# Patient Record
Sex: Female | Born: 1985 | Race: Black or African American | Hispanic: No | Marital: Single | Smoking: Former smoker
Health system: Southern US, Community
[De-identification: ages and names within clinical notes are randomized; demographics above are authoritative.]

## PROBLEM LIST (undated history)

## (undated) DIAGNOSIS — F419 Anxiety disorder, unspecified: Secondary | ICD-10-CM

## (undated) DIAGNOSIS — F329 Major depressive disorder, single episode, unspecified: Secondary | ICD-10-CM

## (undated) DIAGNOSIS — G4700 Insomnia, unspecified: Secondary | ICD-10-CM

## (undated) DIAGNOSIS — I499 Cardiac arrhythmia, unspecified: Secondary | ICD-10-CM

## (undated) DIAGNOSIS — F4310 Post-traumatic stress disorder, unspecified: Secondary | ICD-10-CM

## (undated) DIAGNOSIS — F32A Depression, unspecified: Secondary | ICD-10-CM

## (undated) DIAGNOSIS — J45909 Unspecified asthma, uncomplicated: Secondary | ICD-10-CM

## (undated) HISTORY — PX: DILATION AND CURETTAGE OF UTERUS: SHX78

## (undated) HISTORY — PX: WRIST GANGLION EXCISION: SUR520

---
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
## 2010-02-05 ENCOUNTER — Emergency Department (HOSPITAL_COMMUNITY): Admission: EM | Admit: 2010-02-05 | Discharge: 2010-02-05 | Payer: Self-pay | Admitting: Emergency Medicine

## 2010-08-25 LAB — DIFFERENTIAL
Basophils Absolute: 0 10*3/uL (ref 0.0–0.1)
Lymphocytes Relative: 22 % (ref 12–46)
Lymphs Abs: 2.1 10*3/uL (ref 0.7–4.0)
Monocytes Absolute: 0.7 10*3/uL (ref 0.1–1.0)
Monocytes Relative: 7 % (ref 3–12)
Neutro Abs: 6.6 10*3/uL (ref 1.7–7.7)

## 2010-08-25 LAB — COMPREHENSIVE METABOLIC PANEL
ALT: 15 U/L (ref 0–35)
AST: 18 U/L (ref 0–37)
Albumin: 4.2 g/dL (ref 3.5–5.2)
BUN: 8 mg/dL (ref 6–23)
GFR calc non Af Amer: >60 mL/min (ref >60)
Total Bilirubin: 0.5 mg/dL (ref 0.3–1.2)

## 2010-08-25 LAB — URINALYSIS, ROUTINE W REFLEX MICROSCOPIC
Nitrite: NEGATIVE
Protein, ur: NEGATIVE mg/dL
pH: 6 (ref 5.0–8.0)

## 2010-08-25 LAB — CBC
Hemoglobin: 12.6 g/dL (ref 12.0–15.0)
MCHC: 31.9 g/dL (ref 30.0–36.0)
MCV: 78.2 fL (ref 78.0–100.0)
RBC: 5.05 MIL/uL (ref 3.87–5.11)
RDW: 13.2 % (ref 11.5–15.5)
WBC: 9.6 10*3/uL (ref 4.0–10.5)

## 2010-08-25 LAB — POCT PREGNANCY, URINE: Preg Test, Ur: NEGATIVE

## 2011-11-27 ENCOUNTER — Emergency Department (HOSPITAL_COMMUNITY): Payer: No Typology Code available for payment source

## 2011-11-27 ENCOUNTER — Encounter (HOSPITAL_COMMUNITY): Payer: Self-pay | Admitting: *Deleted

## 2011-11-27 ENCOUNTER — Emergency Department (HOSPITAL_COMMUNITY)
Admission: EM | Admit: 2011-11-27 | Discharge: 2011-11-27 | Disposition: A | Discharge status: Home / Self Care | Payer: No Typology Code available for payment source | Attending: Emergency Medicine | Admitting: Emergency Medicine

## 2011-11-27 DIAGNOSIS — M25529 Pain in unspecified elbow: Secondary | ICD-10-CM | POA: Insufficient documentation

## 2011-11-27 DIAGNOSIS — M79609 Pain in unspecified limb: Secondary | ICD-10-CM | POA: Insufficient documentation

## 2011-11-27 MED ORDER — CYCLOBENZAPRINE HCL 10 MG PO TABS: 10.0000 mg | ORAL_TABLET | Freq: Three times a day (TID) | ORAL | Status: AC | PRN

## 2011-11-27 MED ORDER — OXYCODONE-ACETAMINOPHEN 5-325 MG PO TABS
1.0000 | ORAL_TABLET | Freq: Once | ORAL | Status: AC
  Administered 2011-11-27: 1 via ORAL
  Filled 2011-11-27: qty 1

## 2011-11-27 MED ORDER — IBUPROFEN 800 MG PO TABS: 800.0000 mg | ORAL_TABLET | Freq: Three times a day (TID) | ORAL | Status: AC

## 2011-11-27 MED ORDER — IBUPROFEN 800 MG PO TABS: 800.0000 mg | ORAL_TABLET | Freq: Once | ORAL | Status: DC

## 2011-11-27 MED ORDER — HYDROXYZINE HCL 25 MG PO TABS
ORAL_TABLET | ORAL | Status: AC
  Filled 2011-11-27: qty 1

## 2011-11-27 MED ORDER — OXYCODONE-ACETAMINOPHEN 5-325 MG PO TABS: 2.0000 | ORAL_TABLET | ORAL | Status: AC | PRN
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
## 2011-11-29 ENCOUNTER — Encounter (HOSPITAL_COMMUNITY): Payer: Self-pay | Admitting: Emergency Medicine

## 2011-11-29 ENCOUNTER — Emergency Department (HOSPITAL_COMMUNITY): Payer: No Typology Code available for payment source

## 2011-11-29 ENCOUNTER — Emergency Department (HOSPITAL_COMMUNITY)
Admission: EM | Admit: 2011-11-29 | Discharge: 2011-11-29 | Disposition: A | Discharge status: Home / Self Care | Payer: No Typology Code available for payment source | Attending: Emergency Medicine | Admitting: Emergency Medicine

## 2011-11-29 DIAGNOSIS — S43402A Unspecified sprain of left shoulder joint, initial encounter: Secondary | ICD-10-CM

## 2011-11-29 DIAGNOSIS — J45909 Unspecified asthma, uncomplicated: Secondary | ICD-10-CM | POA: Insufficient documentation

## 2011-11-29 DIAGNOSIS — IMO0002 Reserved for concepts with insufficient information to code with codable children: Secondary | ICD-10-CM | POA: Insufficient documentation

## 2011-11-29 HISTORY — DX: Unspecified asthma, uncomplicated: J45.909

## 2011-11-29 MED ORDER — HYDROMORPHONE HCL PF 2 MG/ML IJ SOLN
2.0000 mg | Freq: Once | INTRAMUSCULAR | Status: AC
  Administered 2011-11-29: 2 mg via INTRAMUSCULAR
  Filled 2011-11-29: qty 1

## 2011-11-29 MED ORDER — HYDROCODONE-ACETAMINOPHEN 5-500 MG PO TABS: 1.0000 | ORAL_TABLET | Freq: Four times a day (QID) | ORAL | Status: AC | PRN

## 2011-11-29 MED ORDER — CYCLOBENZAPRINE HCL 10 MG PO TABS: 10.0000 mg | ORAL_TABLET | Freq: Two times a day (BID) | ORAL | Status: AC | PRN

## 2011-11-29 MED ORDER — IBUPROFEN 800 MG PO TABS: 800.0000 mg | ORAL_TABLET | Freq: Three times a day (TID) | ORAL | Status: AC

## 2011-11-29 NOTE — ED Provider Notes (Signed)
Medical screening examination/treatment/procedure(s) were performed by non-physician practitioner and as supervising physician I was immediately available for consultation/collaboration.   Lyanne Co, MD 11/29/11 1332
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
## 2011-11-29 NOTE — ED Notes (Signed)
Pt vomited x2

## 2011-11-29 NOTE — Discharge Instructions (Signed)
Your x-ray is negative. Keep sling on, continue to ice. Take medications for pain as prescribed. Follow up with orthopedics as referred.   Shoulder Sprain A shoulder sprain is the result of damage to the tough, fiber-like tissues (ligaments) that help hold your shoulder in place. The ligaments may be stretched or torn. Besides the main shoulder joint (the ball and socket), there are several smaller joints that connect the bones in this area. A sprain usually involves one of those joints. Most often it is the acromioclavicular (or AC) joint. That is the joint that connects the collarbone (clavicle) and the shoulder blade (scapula) at the top point of the shoulder blade (acromion). A shoulder sprain is a mild form of what is called a shoulder separation. Recovering from a shoulder sprain may take some time. For some, pain lingers for several months. Most people recover without long term problems. CAUSES   A shoulder sprain is usually caused by some kind of trauma. This might be:   Falling on an outstretched arm.   Being hit hard on the shoulder.   Twisting the arm.   Shoulder sprains are more likely to occur in people who:   Play sports.   Have balance or coordination problems.  SYMPTOMS   Pain when you move your shoulder.   Limited ability to move the shoulder.   Swelling and tenderness on top of the shoulder.   Redness or warmth in the shoulder.   Bruising.   A change in the shape of the shoulder.  DIAGNOSIS  Your healthcare provider may:  Ask about your symptoms.   Ask about recent activity that might have caused those symptoms.   Examine your shoulder. You may be asked to do simple exercises to test movement. The other shoulder will be examined for comparison.   Order some tests that provide a look inside the body. They can show the extent of the injury. The tests could include:   X-rays.   CT (computed tomography) scan.   MRI (magnetic resonance imaging) scan.    RISKS AND COMPLICATIONS  Loss of full shoulder motion.   Ongoing shoulder pain.  TREATMENT  How long it takes to recover from a shoulder sprain depends on how severe it was. Treatment options may include:  Rest. You should not use the arm or shoulder until it heals.   Ice. For 2 or 3 days after the injury, put an ice pack on the shoulder up to 4 times a day. It should stay on for 15 to 20 minutes each time. Wrap the ice in a towel so it does not touch your skin.   Over-the-counter medicine to relieve pain.   A sling or brace. This will keep the arm still while the shoulder is healing.   Physical therapy or rehabilitation exercises. These will help you regain strength and motion. Ask your healthcare provider when it is OK to begin these exercises.   Surgery. The need for surgery is rare with a sprained shoulder, but some people may need surgery to keep the joint in place and reduce pain.  HOME CARE INSTRUCTIONS   Ask your healthcare provider about what you should and should not do while your shoulder heals.   Make sure you know how to apply ice to the correct area of your shoulder.   Talk with your healthcare provider about which medications should be used for pain and swelling.   If rehabilitation therapy will be needed, ask your healthcare provider to refer you  to a therapist. If it is not recommended, then ask about at-home exercises. Find out when exercise should begin.  SEEK MEDICAL CARE IF:  Your pain, swelling, or redness at the joint increases. SEEK IMMEDIATE MEDICAL CARE IF:   You have a fever.   You cannot move your arm or shoulder.  Document Released: 10/14/2008 Document Revised: 05/17/2011 Document Reviewed: 10/14/2008 [facility] Patient Information 2012 [place], [place].

## 2011-11-29 NOTE — ED Provider Notes (Signed)
Medical screening examination/treatment/procedure(s) were performed by non-physician practitioner and as supervising physician I was immediately available for consultation/collaboration.   Dontez Hauss A Zayvon Alicea, MD 11/29/11 1618 

## 2011-11-29 NOTE — ED Notes (Signed)
Pt reports feeling lightheaded. HR 57 and regular.Denies nausea
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
## 2011-11-29 NOTE — ED Notes (Signed)
Pt was given 300 cc clear fluids. PA advised of low heart rate. PA stated that HR same as 2 days ago. Pt advised of low heart rate

## 2012-03-04 ENCOUNTER — Encounter (HOSPITAL_COMMUNITY): Payer: Self-pay | Admitting: Emergency Medicine

## 2012-03-04 ENCOUNTER — Emergency Department (HOSPITAL_COMMUNITY): Payer: Self-pay

## 2012-03-04 ENCOUNTER — Emergency Department (HOSPITAL_COMMUNITY)
Admission: EM | Admit: 2012-03-04 | Discharge: 2012-03-04 | Disposition: A | Discharge status: Home / Self Care | Payer: Self-pay | Attending: Emergency Medicine | Admitting: Emergency Medicine

## 2012-03-04 DIAGNOSIS — R51 Headache: Secondary | ICD-10-CM | POA: Insufficient documentation

## 2012-03-04 DIAGNOSIS — R079 Chest pain, unspecified: Secondary | ICD-10-CM | POA: Insufficient documentation

## 2012-03-04 DIAGNOSIS — R0789 Other chest pain: Secondary | ICD-10-CM

## 2012-03-04 DIAGNOSIS — J45909 Unspecified asthma, uncomplicated: Secondary | ICD-10-CM | POA: Insufficient documentation

## 2012-03-04 DIAGNOSIS — R55 Syncope and collapse: Secondary | ICD-10-CM | POA: Insufficient documentation

## 2012-03-04 DIAGNOSIS — R071 Chest pain on breathing: Secondary | ICD-10-CM | POA: Insufficient documentation

## 2012-03-04 DIAGNOSIS — F4310 Post-traumatic stress disorder, unspecified: Secondary | ICD-10-CM | POA: Insufficient documentation

## 2012-03-04 HISTORY — DX: Anxiety disorder, unspecified: F41.9

## 2012-03-04 HISTORY — DX: Cardiac arrhythmia, unspecified: I49.9

## 2012-03-04 LAB — CBC WITH DIFFERENTIAL/PLATELET
Basophils Relative: 0 % (ref 0–1)
Eosinophils Absolute: 0.2 10*3/uL (ref 0.0–0.7)
HCT: 33.6 % — ABNORMAL LOW (ref 36.0–46.0)
Hemoglobin: 10.7 g/dL — ABNORMAL LOW (ref 12.0–15.0)
MCHC: 31.8 g/dL (ref 30.0–36.0)
Monocytes Absolute: 0.5 10*3/uL (ref 0.1–1.0)
Monocytes Relative: 5 % (ref 3–12)
RBC: 4.47 MIL/uL (ref 3.87–5.11)

## 2012-03-04 LAB — COMPREHENSIVE METABOLIC PANEL
ALT: 12 U/L (ref 0–35)
Albumin: 3.2 g/dL — ABNORMAL LOW (ref 3.5–5.2)
BUN: 7 mg/dL (ref 6–23)
Calcium: 8.7 mg/dL (ref 8.4–10.5)
Chloride: 107 mEq/L (ref 96–112)
Creatinine, Ser: 0.61 mg/dL (ref 0.50–1.10)
Sodium: 138 mEq/L (ref 135–145)
Total Bilirubin: 0.2 mg/dL — ABNORMAL LOW (ref 0.3–1.2)

## 2012-03-04 LAB — URINALYSIS, ROUTINE W REFLEX MICROSCOPIC
Bilirubin Urine: NEGATIVE
Nitrite: NEGATIVE
Protein, ur: NEGATIVE mg/dL
Specific Gravity, Urine: 1.009 (ref 1.005–1.030)
pH: 7.5 (ref 5.0–8.0)

## 2012-03-04 LAB — POCT PREGNANCY, URINE: Preg Test, Ur: NEGATIVE

## 2012-03-04 MED ORDER — ALBUTEROL SULFATE HFA 108 (90 BASE) MCG/ACT IN AERS: 2.0000 | INHALATION_SPRAY | RESPIRATORY_TRACT | Status: DC | PRN

## 2012-03-04 MED ORDER — DIPHENHYDRAMINE HCL 50 MG/ML IJ SOLN
25.0000 mg | Freq: Once | INTRAMUSCULAR | Status: AC
  Administered 2012-03-04: 25 mg via INTRAVENOUS
  Filled 2012-03-04: qty 1

## 2012-03-04 MED ORDER — METOCLOPRAMIDE HCL 5 MG/ML IJ SOLN
10.0000 mg | Freq: Once | INTRAMUSCULAR | Status: AC
  Administered 2012-03-04: 10 mg via INTRAVENOUS
  Filled 2012-03-04: qty 2

## 2012-03-04 MED ORDER — DEXAMETHASONE SODIUM PHOSPHATE 10 MG/ML IJ SOLN
10.0000 mg | Freq: Once | INTRAMUSCULAR | Status: AC
  Administered 2012-03-04: 10 mg via INTRAVENOUS
  Filled 2012-03-04: qty 1

## 2012-03-04 MED ORDER — SODIUM CHLORIDE 0.9 % IV BOLUS (SEPSIS)
1000.0000 mL | Freq: Once | INTRAVENOUS | Status: AC
  Administered 2012-03-04: 1000 mL via INTRAVENOUS

## 2012-03-04 MED ORDER — ALBUTEROL SULFATE HFA 108 (90 BASE) MCG/ACT IN AERS
2.0000 | INHALATION_SPRAY | RESPIRATORY_TRACT | Status: DC | PRN
  Administered 2012-03-04: 2 via RESPIRATORY_TRACT
  Filled 2012-03-04: qty 6.7

## 2012-03-04 MED ORDER — ONDANSETRON HCL 4 MG/2ML IJ SOLN
4.0000 mg | Freq: Once | INTRAMUSCULAR | Status: AC
  Administered 2012-03-04: 4 mg via INTRAVENOUS
  Filled 2012-03-04: qty 2

## 2012-03-04 MED ORDER — MORPHINE SULFATE 4 MG/ML IJ SOLN
4.0000 mg | Freq: Once | INTRAMUSCULAR | Status: AC
  Administered 2012-03-04: 4 mg via INTRAVENOUS
  Filled 2012-03-04: qty 1
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
## 2012-03-04 NOTE — ED Notes (Signed)
Per EMS: Pt was found down at [facility].  Unknown LOC.  Yesterday and this morning, pt has been vomiting.  Initially, pt was breathing 120/min for at least 5 mins.  Pt unwilling to respond to most questions.  When she does answer, pt speaks very softly.  Answers to all questions that she does answer are appropriate.  Pt now breathing about 80 times a min.  Pt c/o chest wall pain (worse with breathing and palpation), abdominal pain.

## 2012-03-04 NOTE — ED Notes (Signed)
ZOX:[id number]<VW> Expected date:<BR> Expected time:<BR> Means of arrival:<BR> Comments:<BR> [id number] hyperventilation; Hx anxiety; irreg HR

## 2012-04-06 ENCOUNTER — Encounter (HOSPITAL_COMMUNITY): Payer: Self-pay | Admitting: *Deleted

## 2012-04-06 ENCOUNTER — Emergency Department (HOSPITAL_COMMUNITY)
Admission: EM | Admit: 2012-04-06 | Discharge: 2012-04-06 | Disposition: A | Discharge status: Home / Self Care | Payer: Self-pay | Attending: Emergency Medicine | Admitting: Emergency Medicine

## 2012-04-06 DIAGNOSIS — F17200 Nicotine dependence, unspecified, uncomplicated: Secondary | ICD-10-CM | POA: Insufficient documentation

## 2012-04-06 DIAGNOSIS — Z8679 Personal history of other diseases of the circulatory system: Secondary | ICD-10-CM | POA: Insufficient documentation

## 2012-04-06 DIAGNOSIS — F411 Generalized anxiety disorder: Secondary | ICD-10-CM | POA: Insufficient documentation

## 2012-04-06 DIAGNOSIS — Z79899 Other long term (current) drug therapy: Secondary | ICD-10-CM | POA: Insufficient documentation

## 2012-04-06 DIAGNOSIS — J45909 Unspecified asthma, uncomplicated: Secondary | ICD-10-CM | POA: Insufficient documentation

## 2012-04-06 DIAGNOSIS — K047 Periapical abscess without sinus: Secondary | ICD-10-CM | POA: Insufficient documentation

## 2012-04-06 MED ORDER — HYDROCODONE-ACETAMINOPHEN 5-325 MG PO TABS
2.0000 | ORAL_TABLET | Freq: Once | ORAL | Status: AC
  Administered 2012-04-06: 2 via ORAL
  Filled 2012-04-06: qty 2

## 2012-04-06 MED ORDER — HYDROCODONE-ACETAMINOPHEN 5-500 MG PO TABS: 1.0000 | ORAL_TABLET | Freq: Four times a day (QID) | ORAL | Status: DC | PRN

## 2012-04-06 MED ORDER — PENICILLIN V POTASSIUM 500 MG PO TABS
500.0000 mg | ORAL_TABLET | Freq: Once | ORAL | Status: AC
  Administered 2012-04-06: 500 mg via ORAL
  Filled 2012-04-06: qty 1

## 2012-04-06 MED ORDER — PENICILLIN V POTASSIUM 500 MG PO TABS: 500.0000 mg | ORAL_TABLET | Freq: Four times a day (QID) | ORAL | Status: DC

## 2012-04-06 NOTE — ED Provider Notes (Signed)
History     CSN: [id number]  Arrival date & time 04/06/12  2002   First MD Initiated Contact with Patient 04/06/12 2031      Chief Complaint  Patient presents with  . Dental Pain    (Consider location/radiation/quality/duration/timing/severity/associated sxs/prior treatment) HPI Comments: This is a 26 year old female, who presents to the emergency department with chief complaint of dental pain. Patient states that the pain began several days ago, and is now radiating to her throat and jaw. Patient has not tried anything for pain. She is in moderate to severe discomfort.  The history is provided by the patient. No language interpreter was used.    Past Medical History  Diagnosis Date  . Asthma   . Anxiety   . Arrhythmia     Past Surgical History  Procedure Date  . Wrist ganglion excision     Family History  Problem Relation Age of Onset  . Hypertension Mother     History  Substance Use Topics  . Smoking status: Current Some Day Smoker    Types: Cigars  . Smokeless tobacco: Not on file  . Alcohol Use: Yes     socia;    OB History    Grav Para Term Preterm Abortions TAB SAB Ect Mult Living                  Review of Systems  HENT: Positive for dental problem.        Left lower tooth/dental abscess, surrounding oral mucosa erythematous and swollen.  All other systems reviewed and are negative.    Allergies  Calamine-diphenhydramine-lanolin oil  Home Medications   Current Outpatient Rx  Name Route Sig Dispense Refill  . ACETAMINOPHEN 500 MG PO TABS Oral Take 500 mg by mouth every 6 (six) hours as needed. pain    . ALBUTEROL SULFATE HFA 108 (90 BASE) MCG/ACT IN AERS Inhalation Inhale 2 puffs into the lungs every 6 (six) hours as needed. wheezing    . HYDROCODONE-ACETAMINOPHEN 5-500 MG PO TABS Oral Take 1-2 tablets by mouth every 6 (six) hours as needed for pain. 15 tablet 0  . PENICILLIN V POTASSIUM 500 MG PO TABS Oral Take 1 tablet (500 mg total) by  mouth 4 (four) times daily. 40 tablet 0    BP 123/94  Pulse 93  Temp 99.2 F (37.3 C) (Oral)  Resp 16  Ht 5\' 2"  (1.575 m)  Wt 175 lb (79.379 kg)  BMI 32.01 kg/m2  SpO2 100%  LMP 03/10/2012  Physical Exam  Nursing note and vitals reviewed. Constitutional: She is oriented to person, place, and time. She appears well-developed and well-nourished.  HENT:  Head: Normocephalic and atraumatic.       Left lower tooth abscess, surrounding oral mucosa erythematous and swollen. Airway intact.  Eyes: Conjunctivae normal and EOM are normal. Pupils are equal, round, and reactive to light.  Neck: Normal range of motion. Neck supple.  Cardiovascular: Normal rate, regular rhythm and normal heart sounds.   Pulmonary/Chest: Effort normal and breath sounds normal.  Abdominal: Soft. Bowel sounds are normal.  Musculoskeletal: Normal range of motion.  Neurological: She is alert and oriented to person, place, and time.  Skin: Skin is warm and dry.  Psychiatric: She has a normal mood and affect. Her behavior is normal. Judgment and thought content normal.    ED Course  Procedures (including critical care time)  Labs Reviewed - No data to display No results found.   1. Dental abscess  MDM  This is a 26 year old female with tooth abscess. This patient has been seen by and discussed with Dr. Bruce Donath. I am going to discharge the patient with penicillin and hydrocodone for her infection and pain management. Will give her one dose of penicillin tonight. Will instruct the patient to see a dentist as soon as possible. I have given her a list of resources. Patient is stable and ready for discharge.        Roxy Horseman, PA-C 04/06/12 2136

## 2012-04-06 NOTE — ED Notes (Signed)
Pt reports left side mouth pain x several days; swelling to left back of gums and cheek; pain radiating to throat and causing headache; denies difficulty breathing; able to swallow but is painful to swallow.

## 2012-04-06 NOTE — ED Provider Notes (Signed)
Medical screening examination/treatment/procedure(s) were performed by non-physician practitioner and as supervising physician I was immediately available for consultation/collaboration.  Toy Baker, MD 04/06/12 2239

## 2012-11-17 ENCOUNTER — Emergency Department (HOSPITAL_COMMUNITY)
Admission: EM | Admit: 2012-11-17 | Discharge: 2012-11-17 | Disposition: A | Discharge status: Home / Self Care | Payer: Self-pay | Attending: Emergency Medicine | Admitting: Emergency Medicine

## 2012-11-17 DIAGNOSIS — Z3202 Encounter for pregnancy test, result negative: Secondary | ICD-10-CM | POA: Insufficient documentation

## 2012-11-17 DIAGNOSIS — F411 Generalized anxiety disorder: Secondary | ICD-10-CM | POA: Insufficient documentation

## 2012-11-17 DIAGNOSIS — F419 Anxiety disorder, unspecified: Secondary | ICD-10-CM

## 2012-11-17 DIAGNOSIS — J45901 Unspecified asthma with (acute) exacerbation: Secondary | ICD-10-CM | POA: Insufficient documentation

## 2012-11-17 DIAGNOSIS — Z8679 Personal history of other diseases of the circulatory system: Secondary | ICD-10-CM | POA: Insufficient documentation

## 2012-11-17 DIAGNOSIS — R109 Unspecified abdominal pain: Secondary | ICD-10-CM | POA: Insufficient documentation

## 2012-11-17 DIAGNOSIS — Z79899 Other long term (current) drug therapy: Secondary | ICD-10-CM | POA: Insufficient documentation

## 2012-11-17 DIAGNOSIS — F17200 Nicotine dependence, unspecified, uncomplicated: Secondary | ICD-10-CM | POA: Insufficient documentation

## 2012-11-17 LAB — URINALYSIS, ROUTINE W REFLEX MICROSCOPIC
Bilirubin Urine: NEGATIVE
Glucose, UA: NEGATIVE mg/dL
Ketones, ur: NEGATIVE mg/dL
Nitrite: NEGATIVE
pH: 7.5 (ref 5.0–8.0)

## 2012-11-17 LAB — POCT I-STAT, CHEM 8
BUN: 9 mg/dL (ref 6–23)
Chloride: 109 mEq/L (ref 96–112)
Creatinine, Ser: 0.7 mg/dL (ref 0.50–1.10)
Glucose, Bld: 87 mg/dL (ref 70–99)
Potassium: 4 mEq/L (ref 3.5–5.1)

## 2012-11-17 LAB — URINE MICROSCOPIC-ADD ON

## 2012-11-17 MED ORDER — NITROFURANTOIN MONOHYD MACRO 100 MG PO CAPS: 100.0000 mg | ORAL_CAPSULE | Freq: Two times a day (BID) | ORAL | Status: DC

## 2012-11-17 MED ORDER — ALBUTEROL SULFATE HFA 108 (90 BASE) MCG/ACT IN AERS: 2.0000 | INHALATION_SPRAY | RESPIRATORY_TRACT | Status: DC | PRN

## 2012-11-17 MED ORDER — LORAZEPAM 1 MG PO TABS
1.0000 mg | ORAL_TABLET | Freq: Once | ORAL | Status: AC
  Administered 2012-11-17: 1 mg via ORAL
  Filled 2012-11-17: qty 1

## 2012-11-17 NOTE — ED Notes (Signed)
Pt states mom passed recently with a stroke.  Pt states that chest tightness started x 1 week ago.  Shortness of breath with any activity.  Pt also states has hx of arrythmia.

## 2012-11-17 NOTE — ED Provider Notes (Signed)
History     CSN: [id number]  Arrival date & time 11/17/12  3664   First MD Initiated Contact with Patient 11/17/12 [phone]      Chief Complaint  Patient presents with  . Chest Pain  . Shortness of Breath    (Consider location/radiation/quality/duration/timing/severity/associated sxs/prior treatment) HPI Complaint of chest pain intermittent since her mother died 2012-09-22. Pain is described as tightness accompanied by shortness of breath not made worse by exertion. It is made worse when she develops anxiety. She also complains of abdominal pain, hypogastric and location onset approximately 3 weeks ago . Abdominal pain is also intermittent. Last bowel movement yesterday normal. No vaginal discharge. She does report a foul smell to her urine. She grits and eggs yesterday. Last normal menstrual period 11/11/2012 no treatment prior to coming here nothing makes abdominal pain better or worse.  Past Medical History  Diagnosis Date  . Asthma   . Anxiety   . Arrhythmia    Arrhythmia for which she does not take medication Past Surgical History  Procedure Laterality Date  . Wrist ganglion excision      Family History  Problem Relation Age of Onset  . Hypertension Mother     History  Substance Use Topics  . Smoking status: Current Some Day Smoker    Types: Cigars  . Smokeless tobacco: Not on file  . Alcohol Use: Yes     Comment: socia;    OB History   Grav Para Term Preterm Abortions TAB SAB Ect Mult Living                  Review of Systems  Constitutional: Negative.   HENT: Negative.   Respiratory: Positive for shortness of breath.   Cardiovascular: Positive for chest pain.  Gastrointestinal: Positive for abdominal pain.  Musculoskeletal: Negative.   Skin: Negative.   Neurological: Negative.   Psychiatric/Behavioral: Negative.   All other systems reviewed and are negative.    Allergies  Calamine-diphenhydramine-lanolin oil  Home Medications   Current Outpatient  Rx  Name  Route  Sig  Dispense  Refill  . acetaminophen (TYLENOL) 500 MG tablet   Oral   Take 500 mg by mouth every 6 (six) hours as needed. pain         . albuterol (PROVENTIL HFA;VENTOLIN HFA) 108 (90 BASE) MCG/ACT inhaler   Inhalation   Inhale 2 puffs into the lungs every 6 (six) hours as needed. wheezing         . HYDROcodone-acetaminophen (VICODIN) 5-500 MG per tablet   Oral   Take 1-2 tablets by mouth every 6 (six) hours as needed for pain.   15 tablet   0   . penicillin v potassium (VEETID) 500 MG tablet   Oral   Take 1 tablet (500 mg total) by mouth 4 (four) times daily.   40 tablet   0     BP 115/75  Pulse 80  Resp 18  SpO2 100%  LMP 11/11/2012  Physical Exam  Nursing note and vitals reviewed. Constitutional: She is oriented to person, place, and time. She appears well-developed and well-nourished. She appears distressed.  Teary-eyed anxious appearing  HENT:  Head: Normocephalic and atraumatic.  Eyes: Conjunctivae are normal. Pupils are equal, round, and reactive to light.  Neck: Neck supple. No tracheal deviation present. No thyromegaly present.  Cardiovascular: Normal rate and regular rhythm.   No murmur heard. Pulmonary/Chest: Effort normal and breath sounds normal.  Abdominal: Soft. Bowel sounds are normal. She exhibits  no distension. There is no tenderness.  Obese  Musculoskeletal: Normal range of motion. She exhibits no edema and no tenderness.  Neurological: She is alert and oriented to person, place, and time. Coordination normal.  Skin: Skin is warm and dry. No rash noted.  Psychiatric: She has a normal mood and affect.    ED Course  Procedures (including critical care time)  Labs Reviewed - No data to display No results found.   No diagnosis found.   Date: 11/17/2012  Rate: 95  Rhythm: normal sinus rhythm  QRS Axis: normal  Intervals: QT prolonged  ST/T Wave abnormalities: nonspecific T wave changes  Conduction Disutrbances:none   Narrative Interpretation:   Old EKG Reviewed: Corrected QTC interval was 0.57 which is long compared to 03/04/2012 interpreted by me  Results for orders placed during the hospital encounter of 11/17/12  URINALYSIS, ROUTINE W REFLEX MICROSCOPIC      Result Value Range   Color, Urine YELLOW  YELLOW   APPearance CLOUDY (*) CLEAR   Specific Gravity, Urine 1.025  1.005 - 1.030   pH 7.5  5.0 - 8.0   Glucose, UA NEGATIVE  NEGATIVE mg/dL   Hgb urine dipstick SMALL (*) NEGATIVE   Bilirubin Urine NEGATIVE  NEGATIVE   Ketones, ur NEGATIVE  NEGATIVE mg/dL   Protein, ur 30 (*) NEGATIVE mg/dL   Urobilinogen, UA 1.0  0.0 - 1.0 mg/dL   Nitrite NEGATIVE  NEGATIVE   Leukocytes, UA LARGE (*) NEGATIVE  PREGNANCY, URINE      Result Value Range   Preg Test, Ur NEGATIVE  NEGATIVE  URINE MICROSCOPIC-ADD ON      Result Value Range   Squamous Epithelial / LPF FEW (*) RARE   WBC, UA TOO NUMEROUS TO COUNT  <3 WBC/hpf   Bacteria, UA MANY (*) RARE   Urine-Other MUCOUS PRESENT    POCT I-STAT, CHEM 8      Result Value Range   Sodium 143  135 - 145 mEq/L   Potassium 4.0  3.5 - 5.1 mEq/L   Chloride 109  96 - 112 mEq/L   BUN 9  6 - 23 mg/dL   Creatinine, Ser 1.61  0.50 - 1.10 mg/dL   Glucose, Bld 87  70 - 99 mg/dL   Calcium, Ion 0.96  0.45 - 1.23 mmol/L   TCO2 25  0 - 100 mmol/L   Hemoglobin 12.6  12.0 - 15.0 g/dL   HCT 40.9  81.1 - 91.4 %   No results found.   MDM  I feel the chest pain is not consistent with acute coronary syndrome rather related to anxiety. In light of her history of arrhythmia for which I can find no documentation, and long QT measured on today's EKG [facility] Abdominal pain explained by uti Plan rx macrobid, referral resource guide for grief councilling         Doug Sou, MD 11/17/12 1709

## 2012-11-17 NOTE — ED Provider Notes (Signed)
History     CSN: [id number]  Arrival date & time 11/17/12  7829   First MD Initiated Contact with Patient 11/17/12 [phone]      Chief Complaint  Patient presents with  . Chest Pain  . Shortness of Breath    (Consider location/radiation/quality/duration/timing/severity/associated sxs/prior treatment) HPI  Past Medical History  Diagnosis Date  . Asthma   . Anxiety   . Arrhythmia     Past Surgical History  Procedure Laterality Date  . Wrist ganglion excision      Family History  Problem Relation Age of Onset  . Hypertension Mother     History  Substance Use Topics  . Smoking status: Current Some Day Smoker    Types: Cigars  . Smokeless tobacco: Not on file  . Alcohol Use: Yes     Comment: socia;    OB History   Grav Para Term Preterm Abortions TAB SAB Ect Mult Living                  Review of Systems  Allergies  Calamine-diphenhydramine-lanolin oil  Home Medications   Current Outpatient Rx  Name  Route  Sig  Dispense  Refill  . naproxen sodium (ANAPROX) 220 MG tablet   Oral   Take 220 mg by mouth 3 (three) times daily as needed (pain).          Marland Kitchen albuterol (PROVENTIL HFA;VENTOLIN HFA) 108 (90 BASE) MCG/ACT inhaler   Inhalation   Inhale 2 puffs into the lungs every 4 (four) hours as needed for wheezing.   1 Inhaler   0   . nitrofurantoin, macrocrystal-monohydrate, (MACROBID) 100 MG capsule   Oral   Take 1 capsule (100 mg total) by mouth 2 (two) times daily.   10 capsule   0     BP 115/75  Pulse 80  Temp(Src) 98.5 F (36.9 C) (Oral)  Resp 18  SpO2 100%  LMP 11/11/2012  Physical Exam  ED Course  Procedures (including critical care time)  Labs Reviewed  URINALYSIS, ROUTINE W REFLEX MICROSCOPIC - Abnormal; Notable for the following:    APPearance CLOUDY (*)    Hgb urine dipstick SMALL (*)    Protein, ur 30 (*)    Leukocytes, UA LARGE (*)    All other components within normal limits  URINE MICROSCOPIC-ADD ON - Abnormal; Notable  for the following:    Squamous Epithelial / LPF FEW (*)    Bacteria, UA MANY (*)    All other components within normal limits  URINE CULTURE  PREGNANCY, URINE  POCT I-STAT, CHEM 8   No results found.   1. Anxiety    10:20 AM patient feels improved after treatment with Ativan. Results for orders placed during the hospital encounter of 11/17/12  URINALYSIS, ROUTINE W REFLEX MICROSCOPIC      Result Value Range   Color, Urine YELLOW  YELLOW   APPearance CLOUDY (*) CLEAR   Specific Gravity, Urine 1.025  1.005 - 1.030   pH 7.5  5.0 - 8.0   Glucose, UA NEGATIVE  NEGATIVE mg/dL   Hgb urine dipstick SMALL (*) NEGATIVE   Bilirubin Urine NEGATIVE  NEGATIVE   Ketones, ur NEGATIVE  NEGATIVE mg/dL   Protein, ur 30 (*) NEGATIVE mg/dL   Urobilinogen, UA 1.0  0.0 - 1.0 mg/dL   Nitrite NEGATIVE  NEGATIVE   Leukocytes, UA LARGE (*) NEGATIVE  PREGNANCY, URINE      Result Value Range   Preg Test, Ur NEGATIVE  NEGATIVE  URINE MICROSCOPIC-ADD ON      Result Value Range   Squamous Epithelial / LPF FEW (*) RARE   WBC, UA TOO NUMEROUS TO COUNT  <3 WBC/hpf   Bacteria, UA MANY (*) RARE   Urine-Other MUCOUS PRESENT    POCT I-STAT, CHEM 8      Result Value Range   Sodium 143  135 - 145 mEq/L   Potassium 4.0  3.5 - 5.1 mEq/L   Chloride 109  96 - 112 mEq/L   BUN 9  6 - 23 mg/dL   Creatinine, Ser 4.54  0.50 - 1.10 mg/dL   Glucose, Bld 87  70 - 99 mg/dL   Calcium, Ion 0.98  1.19 - 1.23 mmol/L   TCO2 25  0 - 100 mmol/L   Hemoglobin 12.6  12.0 - 15.0 g/dL   HCT 14.7  82.9 - 56.2 %   No results found.   MDM  I feel that cause of pts chest pain  Is anxiety and she gwets tearful when speaking of death of her mother. Long QT is incidental finding on EKG.  Spoke with [facility] cardiology to arrange out pt offcie visit .  Plan rx albuterol (pt requests refill as has no pmd, ) macrobid'  Referral resource guide. Dx # 1 atypical chest pain #2 anxiety #3 uti        Doug Sou, MD 11/17/12  1032

## 2012-11-17 NOTE — Progress Notes (Signed)
[facility] has seen patient and provided her with a list of primary care resources. °

## 2012-11-19 LAB — URINE CULTURE

## 2012-11-21 NOTE — ED Notes (Signed)
Post ED Visit - Positive Culture Follow-up  Culture report reviewed by antimicrobial stewardship pharmacist: [x]  Wes Dulaney, Pharm.D., BCPS []  Celedonio Miyamoto, Pharm.D., BCPS []  Georgina Pillion, Pharm.D., BCPS []  [place], [place].D., BCPS, AAHIVP []  Estella Husk, Pharm.D., BCPS, AAHIVP  Positive urine culture Treated with Nitrofuratoin organism sensitive to the same and no further patient follow-up is required at this time.  Larena Sox 11/21/2012, 11:47 AM

## 2012-12-10 ENCOUNTER — Encounter: Payer: Self-pay | Admitting: Internal Medicine

## 2012-12-11 ENCOUNTER — Encounter: Payer: Self-pay | Admitting: Internal Medicine

## 2012-12-28 ENCOUNTER — Emergency Department (HOSPITAL_COMMUNITY)
Admission: EM | Admit: 2012-12-28 | Discharge: 2012-12-28 | Disposition: A | Discharge status: Home / Self Care | Payer: Self-pay | Attending: Emergency Medicine | Admitting: Emergency Medicine

## 2012-12-28 ENCOUNTER — Encounter (HOSPITAL_COMMUNITY): Payer: Self-pay

## 2012-12-28 DIAGNOSIS — M545 Low back pain, unspecified: Secondary | ICD-10-CM | POA: Insufficient documentation

## 2012-12-28 DIAGNOSIS — M549 Dorsalgia, unspecified: Secondary | ICD-10-CM

## 2012-12-28 DIAGNOSIS — F411 Generalized anxiety disorder: Secondary | ICD-10-CM | POA: Insufficient documentation

## 2012-12-28 DIAGNOSIS — R109 Unspecified abdominal pain: Secondary | ICD-10-CM | POA: Insufficient documentation

## 2012-12-28 DIAGNOSIS — R259 Unspecified abnormal involuntary movements: Secondary | ICD-10-CM | POA: Insufficient documentation

## 2012-12-28 DIAGNOSIS — Z8679 Personal history of other diseases of the circulatory system: Secondary | ICD-10-CM | POA: Insufficient documentation

## 2012-12-28 DIAGNOSIS — Z87828 Personal history of other (healed) physical injury and trauma: Secondary | ICD-10-CM | POA: Insufficient documentation

## 2012-12-28 DIAGNOSIS — J45909 Unspecified asthma, uncomplicated: Secondary | ICD-10-CM | POA: Insufficient documentation

## 2012-12-28 DIAGNOSIS — F17200 Nicotine dependence, unspecified, uncomplicated: Secondary | ICD-10-CM | POA: Insufficient documentation

## 2012-12-28 DIAGNOSIS — R112 Nausea with vomiting, unspecified: Secondary | ICD-10-CM | POA: Insufficient documentation

## 2012-12-28 DIAGNOSIS — Z3202 Encounter for pregnancy test, result negative: Secondary | ICD-10-CM | POA: Insufficient documentation

## 2012-12-28 LAB — COMPREHENSIVE METABOLIC PANEL
ALT: 12 U/L (ref 0–35)
AST: 14 U/L (ref 0–37)
Albumin: 3.9 g/dL (ref 3.5–5.2)
Alkaline Phosphatase: 80 U/L (ref 39–117)
Chloride: 106 mEq/L (ref 96–112)
Potassium: 4 mEq/L (ref 3.5–5.1)
Sodium: 139 mEq/L (ref 135–145)
Total Bilirubin: 0.2 mg/dL — ABNORMAL LOW (ref 0.3–1.2)
Total Protein: 7.5 g/dL (ref 6.0–8.3)

## 2012-12-28 LAB — CBC WITH DIFFERENTIAL/PLATELET
Basophils Absolute: 0 10*3/uL (ref 0.0–0.1)
Basophils Relative: 0 % (ref 0–1)
Eosinophils Absolute: 0.2 10*3/uL (ref 0.0–0.7)
Hemoglobin: 11.5 g/dL — ABNORMAL LOW (ref 12.0–15.0)
MCH: 23.3 pg — ABNORMAL LOW (ref 26.0–34.0)
MCHC: 30.8 g/dL (ref 30.0–36.0)
Neutro Abs: 6.1 10*3/uL (ref 1.7–7.7)
Neutrophils Relative %: 63 % (ref 43–77)
Platelets: 194 10*3/uL (ref 150–400)
RBC: 4.93 MIL/uL (ref 3.87–5.11)

## 2012-12-28 LAB — URINALYSIS, ROUTINE W REFLEX MICROSCOPIC
Bilirubin Urine: NEGATIVE
Glucose, UA: NEGATIVE mg/dL
Hgb urine dipstick: NEGATIVE
Ketones, ur: NEGATIVE mg/dL
Nitrite: NEGATIVE
Specific Gravity, Urine: 1.019 (ref 1.005–1.030)
pH: 7 (ref 5.0–8.0)

## 2012-12-28 LAB — URINE MICROSCOPIC-ADD ON

## 2012-12-28 LAB — LIPASE, BLOOD: Lipase: 14 U/L (ref 11–59)

## 2012-12-28 MED ORDER — SODIUM CHLORIDE 0.9 % IV BOLUS (SEPSIS): 1000.0000 mL | Freq: Once | INTRAVENOUS | Status: DC

## 2012-12-28 MED ORDER — HYDROMORPHONE HCL PF 1 MG/ML IJ SOLN: 0.5000 mg | Freq: Once | INTRAMUSCULAR | Status: DC

## 2012-12-28 MED ORDER — CYCLOBENZAPRINE HCL 10 MG PO TABS: 10.0000 mg | ORAL_TABLET | Freq: Two times a day (BID) | ORAL | Status: DC | PRN

## 2012-12-28 MED ORDER — ONDANSETRON HCL 4 MG PO TABS
4.0000 mg | ORAL_TABLET | Freq: Once | ORAL | Status: AC
  Administered 2012-12-28: 4 mg via ORAL
  Filled 2012-12-28: qty 1

## 2012-12-28 MED ORDER — ONDANSETRON HCL 4 MG/2ML IJ SOLN: 4.0000 mg | Freq: Once | INTRAMUSCULAR | Status: DC

## 2012-12-28 MED ORDER — OXYCODONE-ACETAMINOPHEN 5-325 MG PO TABS
2.0000 | ORAL_TABLET | Freq: Once | ORAL | Status: AC
  Administered 2012-12-28: 2 via ORAL
  Filled 2012-12-28: qty 2

## 2012-12-28 MED ORDER — NAPROXEN 500 MG PO TABS: 500.0000 mg | ORAL_TABLET | Freq: Two times a day (BID) | ORAL | Status: DC

## 2012-12-28 MED ORDER — OXYCODONE-ACETAMINOPHEN 5-325 MG PO TABS: 2.0000 | ORAL_TABLET | ORAL | Status: DC | PRN

## 2012-12-28 NOTE — ED Notes (Signed)
Pt reports onset lower back pain last night, then started to have n/v/d. Tried to drink ginger ale but could not tolerate. Was unable to sleep last night because of the back pain
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
## 2012-12-28 NOTE — ED Notes (Signed)
EDP Hyacinth Meeker advised not to start IV at this time.  Will hold IV meds and wait for lab results.

## 2012-12-31 LAB — URINE CULTURE: Colony Count: 75000

## 2013-01-01 NOTE — ED Notes (Signed)
+   urine No treatment needed per Sharilyn Sites

## 2015-03-10 ENCOUNTER — Emergency Department (HOSPITAL_COMMUNITY): Payer: No Typology Code available for payment source

## 2015-03-10 ENCOUNTER — Encounter (HOSPITAL_COMMUNITY): Payer: Self-pay

## 2015-03-10 ENCOUNTER — Emergency Department (HOSPITAL_COMMUNITY)
Admission: EM | Admit: 2015-03-10 | Discharge: 2015-03-10 | Disposition: A | Discharge status: Home / Self Care | Payer: No Typology Code available for payment source | Attending: Emergency Medicine | Admitting: Emergency Medicine

## 2015-03-10 DIAGNOSIS — S199XXA Unspecified injury of neck, initial encounter: Secondary | ICD-10-CM | POA: Diagnosis present

## 2015-03-10 DIAGNOSIS — F410 Panic disorder [episodic paroxysmal anxiety] without agoraphobia: Secondary | ICD-10-CM | POA: Insufficient documentation

## 2015-03-10 DIAGNOSIS — Z720 Tobacco use: Secondary | ICD-10-CM | POA: Diagnosis not present

## 2015-03-10 DIAGNOSIS — S29012A Strain of muscle and tendon of back wall of thorax, initial encounter: Secondary | ICD-10-CM | POA: Insufficient documentation

## 2015-03-10 DIAGNOSIS — S2002XA Contusion of left breast, initial encounter: Secondary | ICD-10-CM | POA: Insufficient documentation

## 2015-03-10 DIAGNOSIS — Y999 Unspecified external cause status: Secondary | ICD-10-CM | POA: Diagnosis not present

## 2015-03-10 DIAGNOSIS — S161XXA Strain of muscle, fascia and tendon at neck level, initial encounter: Secondary | ICD-10-CM | POA: Diagnosis not present

## 2015-03-10 DIAGNOSIS — R1030 Lower abdominal pain, unspecified: Secondary | ICD-10-CM

## 2015-03-10 DIAGNOSIS — S4992XA Unspecified injury of left shoulder and upper arm, initial encounter: Secondary | ICD-10-CM | POA: Diagnosis not present

## 2015-03-10 DIAGNOSIS — J45909 Unspecified asthma, uncomplicated: Secondary | ICD-10-CM | POA: Insufficient documentation

## 2015-03-10 DIAGNOSIS — S3991XA Unspecified injury of abdomen, initial encounter: Secondary | ICD-10-CM | POA: Insufficient documentation

## 2015-03-10 DIAGNOSIS — R1110 Vomiting, unspecified: Secondary | ICD-10-CM | POA: Diagnosis not present

## 2015-03-10 DIAGNOSIS — Y9389 Activity, other specified: Secondary | ICD-10-CM | POA: Insufficient documentation

## 2015-03-10 DIAGNOSIS — Y92410 Unspecified street and highway as the place of occurrence of the external cause: Secondary | ICD-10-CM | POA: Diagnosis not present

## 2015-03-10 LAB — URINALYSIS, ROUTINE W REFLEX MICROSCOPIC
BILIRUBIN URINE: NEGATIVE
GLUCOSE, UA: NEGATIVE mg/dL
HGB URINE DIPSTICK: NEGATIVE
Ketones, ur: NEGATIVE mg/dL
Leukocytes, UA: NEGATIVE
Nitrite: NEGATIVE
PROTEIN: NEGATIVE mg/dL
SPECIFIC GRAVITY, URINE: 1.015 (ref 1.005–1.030)
Urobilinogen, UA: 1 mg/dL (ref 0.0–1.0)
pH: 7.5 (ref 5.0–8.0)

## 2015-03-10 LAB — I-STAT BETA HCG BLOOD, ED (MC, WL, AP ONLY): I-stat hCG, quantitative: <5 m[IU]/mL (ref <5)

## 2015-03-10 LAB — I-STAT CHEM 8, ED
BUN: 6 mg/dL (ref 6–20)
CALCIUM ION: 1.21 mmol/L (ref 1.12–1.23)
CHLORIDE: 106 mmol/L (ref 101–111)
Creatinine, Ser: 0.7 mg/dL (ref 0.44–1.00)
GLUCOSE: 84 mg/dL (ref 65–99)
HCT: 39 % (ref 36.0–46.0)
Hemoglobin: 13.3 g/dL (ref 12.0–15.0)
Potassium: 4.4 mmol/L (ref 3.5–5.1)
Sodium: 139 mmol/L (ref 135–145)
TCO2: 24 mmol/L (ref 0–100)

## 2015-03-10 MED ORDER — DIAZEPAM 5 MG PO TABS: 5.0000 mg | ORAL_TABLET | Freq: Two times a day (BID) | ORAL | Status: DC | PRN

## 2015-03-10 MED ORDER — DIAZEPAM 5 MG PO TABS
5.0000 mg | ORAL_TABLET | Freq: Once | ORAL | Status: AC
  Administered 2015-03-10: 5 mg via ORAL
  Filled 2015-03-10: qty 1

## 2015-03-10 MED ORDER — NAPROXEN 500 MG PO TABS: 500.0000 mg | ORAL_TABLET | Freq: Two times a day (BID) | ORAL | Status: DC

## 2015-03-10 MED ORDER — HYDROCODONE-ACETAMINOPHEN 5-325 MG PO TABS
2.0000 | ORAL_TABLET | Freq: Once | ORAL | Status: AC
  Administered 2015-03-10: 2 via ORAL
  Filled 2015-03-10: qty 2

## 2015-03-10 MED ORDER — IOHEXOL 300 MG/ML  SOLN
100.0000 mL | Freq: Once | INTRAMUSCULAR | Status: AC | PRN
  Administered 2015-03-10: 100 mL via INTRAVENOUS
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
## 2015-03-10 NOTE — ED Notes (Signed)
Pt is in stable condition upon d/c and is escorted from ED via wheelchair. 

## 2015-05-13 ENCOUNTER — Emergency Department (HOSPITAL_COMMUNITY): Payer: Self-pay

## 2015-05-13 ENCOUNTER — Encounter (HOSPITAL_COMMUNITY): Payer: Self-pay | Admitting: Emergency Medicine

## 2015-05-13 ENCOUNTER — Emergency Department (HOSPITAL_COMMUNITY)
Admission: EM | Admit: 2015-05-13 | Discharge: 2015-05-13 | Disposition: A | Discharge status: Home / Self Care | Payer: Self-pay | Attending: Emergency Medicine | Admitting: Emergency Medicine

## 2015-05-13 DIAGNOSIS — W19XXXA Unspecified fall, initial encounter: Secondary | ICD-10-CM

## 2015-05-13 DIAGNOSIS — Z791 Long term (current) use of non-steroidal anti-inflammatories (NSAID): Secondary | ICD-10-CM | POA: Insufficient documentation

## 2015-05-13 DIAGNOSIS — S8992XA Unspecified injury of left lower leg, initial encounter: Secondary | ICD-10-CM | POA: Insufficient documentation

## 2015-05-13 DIAGNOSIS — Z8679 Personal history of other diseases of the circulatory system: Secondary | ICD-10-CM | POA: Insufficient documentation

## 2015-05-13 DIAGNOSIS — S99912A Unspecified injury of left ankle, initial encounter: Secondary | ICD-10-CM | POA: Insufficient documentation

## 2015-05-13 DIAGNOSIS — Y9389 Activity, other specified: Secondary | ICD-10-CM | POA: Insufficient documentation

## 2015-05-13 DIAGNOSIS — Y9289 Other specified places as the place of occurrence of the external cause: Secondary | ICD-10-CM | POA: Insufficient documentation

## 2015-05-13 DIAGNOSIS — J45909 Unspecified asthma, uncomplicated: Secondary | ICD-10-CM | POA: Insufficient documentation

## 2015-05-13 DIAGNOSIS — W1789XA Other fall from one level to another, initial encounter: Secondary | ICD-10-CM | POA: Insufficient documentation

## 2015-05-13 DIAGNOSIS — F419 Anxiety disorder, unspecified: Secondary | ICD-10-CM | POA: Insufficient documentation

## 2015-05-13 DIAGNOSIS — F17210 Nicotine dependence, cigarettes, uncomplicated: Secondary | ICD-10-CM | POA: Insufficient documentation

## 2015-05-13 DIAGNOSIS — Z79899 Other long term (current) drug therapy: Secondary | ICD-10-CM | POA: Insufficient documentation

## 2015-05-13 DIAGNOSIS — Y998 Other external cause status: Secondary | ICD-10-CM | POA: Insufficient documentation

## 2015-05-13 MED ORDER — NAPROXEN 250 MG PO TABS
500.0000 mg | ORAL_TABLET | Freq: Once | ORAL | Status: AC
  Administered 2015-05-13: 500 mg via ORAL
  Filled 2015-05-13: qty 2

## 2015-05-13 MED ORDER — NAPROXEN 500 MG PO TABS: 500.0000 mg | ORAL_TABLET | Freq: Two times a day (BID) | ORAL | Status: DC

## 2015-05-13 NOTE — ED Provider Notes (Signed)
CSN: [id number]     Arrival date & time 05/13/15  1244 History  By signing my name below, I, Randa Evens, attest that this documentation has been prepared under the direction and in the presence of Quincy Carnes, PA-C. Electronically Signed: Randa Evens, ED Scribe. 05/13/2015. 1:53 PM.    Chief Complaint  Patient presents with  . Leg Pain   The history is provided by the patient. No language interpreter was used.   HPI Comments: Traci Rogers is a 29 y.o. female who presents to the Emergency Department complaining of left leg pain onset 1 day prior. Pt is also complaining of pain in her left foot and left ankle. Pt states that she stepped off the porch twisting her ankle due to an opossum startling her. She states that she stepped backwards off the porch and lost her footing. She states that the pain is worse with movement and bearing weight. Pt has been applying ice since being in the ED. Pt denies numbness or tingling. No head injury or LOC.  No prior ankle injuries/surgeries.  VSS.  Past Medical History  Diagnosis Date  . Asthma   . Anxiety   . Arrhythmia    Past Surgical History  Procedure Laterality Date  . Wrist ganglion excision     Family History  Problem Relation Age of Onset  . Hypertension Mother    Social History  Substance Use Topics  . Smoking status: Current Some Day Smoker    Types: Cigars  . Smokeless tobacco: None  . Alcohol Use: Yes     Comment: socia;   OB History    No data available     Review of Systems  Musculoskeletal: Positive for arthralgias.  Neurological: Negative for numbness.      Allergies  Calamine  Home Medications   Prior to Admission medications   Medication Sig Start Date End Date Taking? Authorizing Provider  diazepam (VALIUM) 5 MG tablet Take 1 tablet (5 mg total) by mouth every 12 (twelve) hours as needed for muscle spasms. 03/10/15   Carman Ching, PA-C  Multiple Vitamins-Minerals (MULTIVITAMIN WITH MINERALS)  tablet Take 1 tablet by mouth daily.    Historical Provider, MD  naproxen (NAPROSYN) 500 MG tablet Take 1 tablet (500 mg total) by mouth 2 (two) times daily. 03/10/15   Robyn M Hess, PA-C  naproxen sodium (ANAPROX) 220 MG tablet Take 220 mg by mouth 3 (three) times daily as needed (pain).     Historical Provider, MD  oxyCODONE-acetaminophen (PERCOCET/ROXICET) 5-325 MG per tablet Take 2 tablets by mouth every 4 (four) hours as needed for pain. 12/28/12   Noemi Chapel, MD   BP 107/74 mmHg  Pulse 79  Temp(Src) 98.6 F (37 C) (Oral)  Resp 18  SpO2 99%  LMP 04/22/2015   Physical Exam  Constitutional: She is oriented to person, place, and time. She appears well-developed and well-nourished.  HENT:  Head: Normocephalic and atraumatic.  Mouth/Throat: Oropharynx is clear and moist.  Eyes: Conjunctivae and EOM are normal. Pupils are equal, round, and reactive to light.  Neck: Normal range of motion.  Cardiovascular: Normal rate, regular rhythm and normal heart sounds.   Pulmonary/Chest: Effort normal and breath sounds normal.  Abdominal: Soft. Bowel sounds are normal.  Musculoskeletal:       Left ankle: She exhibits decreased range of motion. She exhibits no swelling, no ecchymosis, no deformity, no laceration and normal pulse. Tenderness. Lateral malleolus and medial malleolus tenderness found. Achilles tendon normal.  Left  ankle diffusely tender without significant swelling, bruising, abrasions, or bony deformity; limited ROM due to pain/poor patient effort; DP pulse intact; normal sensation throughout foot; moving all toes appropriately  Neurological: She is alert and oriented to person, place, and time.  Skin: Skin is warm and dry.  Psychiatric: She has a normal mood and affect.  Nursing note and vitals reviewed.   ED Course  ORTHOPEDIC INJURY TREATMENT Date/Time: 05/13/2015 2:13 PM Performed by: Larene Pickett Authorized by: Larene Pickett Consent: Verbal consent obtained. Risks and  benefits: risks, benefits and alternatives were discussed Consent given by: patient Patient understanding: patient states understanding of the procedure being performed Patient consent: the patient's understanding of the procedure matches consent given Required items: required blood products, implants, devices, and special equipment available Patient identity confirmed: verbally with patient Injury location: ankle Location details: left ankle Injury type: soft tissue Pre-procedure neurovascular assessment: neurovascularly intact Immobilization: brace Supplies used: aluminum splint Post-procedure neurovascular assessment: post-procedure neurovascularly intact Patient tolerance: Patient tolerated the procedure well with no immediate complications   (including critical care time) DIAGNOSTIC STUDIES: Oxygen Saturation is 99% on RA, normal by my interpretation.    COORDINATION OF CARE: 2:02 PM-Discussed treatment plan with pt at bedside and pt agreed to plan.     Labs Review Labs Reviewed - No data to display  Imaging Review Dg Tibia/fibula Left  05/13/2015  CLINICAL DATA:  Twisted LEFT ankle while jumping off porch and running from an opossum 1 day ago, medial anterior LEFT ankle/ foot pain, initial encounter EXAM: LEFT TIBIA AND FIBULA - 2 VIEW COMPARISON:  None FINDINGS: Osseous mineralization normal. Joint spaces preserved. No fracture, dislocation, or bone destruction. IMPRESSION: Normal exam. Electronically Signed   By: Lavonia Dana M.D.   On: 05/13/2015 13:43   Dg Ankle Complete Left  05/13/2015  CLINICAL DATA:  Twisted left ankle wall jumping. EXAM: LEFT FOOT - COMPLETE 3+ VIEW; LEFT ANKLE COMPLETE - 3+ VIEW COMPARISON:  None. FINDINGS: There is no evidence of fracture or dislocation. The ankle mortise is intact. There is no evidence of arthropathy or other focal bone abnormality. Soft tissues are unremarkable. IMPRESSION: No acute osseous injury of the left foot or ankle.  Electronically Signed   By: Kathreen Devoid   On: 05/13/2015 13:46   Dg Foot Complete Left  05/13/2015  CLINICAL DATA:  Twisted left ankle wall jumping. EXAM: LEFT FOOT - COMPLETE 3+ VIEW; LEFT ANKLE COMPLETE - 3+ VIEW COMPARISON:  None. FINDINGS: There is no evidence of fracture or dislocation. The ankle mortise is intact. There is no evidence of arthropathy or other focal bone abnormality. Soft tissues are unremarkable. IMPRESSION: No acute osseous injury of the left foot or ankle. Electronically Signed   By: Kathreen Devoid   On: 05/13/2015 13:46   I have personally reviewed and evaluated these images results as part of my medical decision-making.   EKG Interpretation None      MDM   Final diagnoses:  Fall, initial encounter  Left ankle injury, initial encounter   29 year old female here with left ankle pain after falling off porch last night. On exam she has diffuse left ankle pain without significant swelling, bony deformity, bruising or laceration. Her foot is neurovascularly intact. X-rays were obtained which are negative for acute findings. Suspect sprain type injury. Patient placed in ASO ankle brace and given crutches. She will progress back to full weightbearing as tolerated. Follow-up given if no improvement in the next week. Rx Naprosyn.  Discussed plan with patient, he/she acknowledged understanding and agreed with plan of care.  Return precautions given for new or worsening symptoms.  I personally performed the services described in this documentation, which was scribed in my presence. The recorded information has been reviewed and is accurate.  Larene Pickett, PA-C 05/13/15 1414  Veryl Speak, MD 05/13/15 [phone]

## 2015-05-13 NOTE — Discharge Instructions (Signed)
Take the prescribed medication as directed.  Ice and elevate ankle at home to help with pain/swelling. Follow-up with orthopedics if continue having issues after a week or so. Return to the ED for new or worsening symptoms.  RICE for Routine Care of Injuries Theroutine careofmanyinjuriesincludes rest, ice, compression, and elevation (RICE therapy). RICE therapy is often recommended for injuries to soft tissues, such as a muscle strain, ligament injuries, bruises, and overuse injuries. It can also be used for some bony injuries. Using RICE therapy can help to relieve pain, lessen swelling, and enable your body to heal. Rest Rest is required to allow your body to heal. This usually involves reducing your normal activities and avoiding use of the injured part of your body. Generally, you can return to your normal activities when you are comfortable and have been given permission by your health care provider. Ice Icing your injury helps to keep the swelling down, and it lessens pain. Do not apply ice directly to your skin.  Put ice in a plastic bag.  Place a towel between your skin and the bag.  Leave the ice on for 20 minutes, 2-3 times a day. Do this for as long as you are directed by your health care provider. Compression Compression means putting pressure on the injured area. Compression helps to keep swelling down, gives support, and helps with discomfort. Compression may be done with an elastic bandage. If an elastic bandage has been applied, follow these general tips:  Remove and reapply the bandage every 3-4 hours or as directed by your health care provider.  Make sure the bandage is not wrapped too tightly, because this can cut off circulation. If part of your body beyond the bandage becomes blue, numb, cold, swollen, or more painful, your bandage is most likely too tight. If this occurs, remove your bandage and reapply it more loosely.  See your health care provider if the bandage  seems to be making your problems worse rather than better. Elevation Elevation means keeping the injured area raised. This helps to lessen swelling and decrease pain. If possible, your injured area should be elevated at or above the level of your heart or the center of your chest. [place]? You should seek medical care if:  Your pain and swelling continue.  Your symptoms are getting worse rather than improving. These symptoms may indicate that further evaluation or further X-rays are needed. Sometimes, X-rays may not show a small broken bone (fracture) until a number of days later. Make a follow-up appointment with your health care provider. WHEN SHOULD I SEEK IMMEDIATE MEDICAL CARE? You should seek immediate medical care if:  You have sudden severe pain at or below the area of your injury.  You have redness or increased swelling around your injury.  You have tingling or numbness at or below the area of your injury that does not improve after you remove the elastic bandage.   This information is not intended to replace advice given to you by your health care provider. Make sure you discuss any questions you have with your health care provider.   Document Released: 09/09/2000 Document Revised: 02/16/2015 Document Reviewed: 05/05/2014 Elsevier Interactive Patient Education [employer].

## 2015-05-13 NOTE — ED Notes (Signed)
Pt given ice pack for left ankle

## 2015-05-13 NOTE — ED Notes (Signed)
Pt twisted left ankle last pm. C/o left foot,ankle and lower leg pain.

## 2015-07-17 ENCOUNTER — Encounter (HOSPITAL_COMMUNITY): Payer: Self-pay

## 2015-07-17 ENCOUNTER — Emergency Department (HOSPITAL_COMMUNITY)
Admission: EM | Admit: 2015-07-17 | Discharge: 2015-07-17 | Disposition: A | Discharge status: Home / Self Care | Payer: Self-pay | Attending: Emergency Medicine | Admitting: Emergency Medicine

## 2015-07-17 ENCOUNTER — Emergency Department (HOSPITAL_COMMUNITY): Payer: Self-pay

## 2015-07-17 DIAGNOSIS — Y999 Unspecified external cause status: Secondary | ICD-10-CM | POA: Insufficient documentation

## 2015-07-17 DIAGNOSIS — S8391XA Sprain of unspecified site of right knee, initial encounter: Secondary | ICD-10-CM | POA: Insufficient documentation

## 2015-07-17 DIAGNOSIS — Y9289 Other specified places as the place of occurrence of the external cause: Secondary | ICD-10-CM | POA: Insufficient documentation

## 2015-07-17 DIAGNOSIS — Y9389 Activity, other specified: Secondary | ICD-10-CM | POA: Insufficient documentation

## 2015-07-17 DIAGNOSIS — F17210 Nicotine dependence, cigarettes, uncomplicated: Secondary | ICD-10-CM | POA: Insufficient documentation

## 2015-07-17 DIAGNOSIS — Z791 Long term (current) use of non-steroidal anti-inflammatories (NSAID): Secondary | ICD-10-CM | POA: Insufficient documentation

## 2015-07-17 DIAGNOSIS — J45909 Unspecified asthma, uncomplicated: Secondary | ICD-10-CM | POA: Insufficient documentation

## 2015-07-17 DIAGNOSIS — F419 Anxiety disorder, unspecified: Secondary | ICD-10-CM | POA: Insufficient documentation

## 2015-07-17 MED ORDER — LORAZEPAM 2 MG/ML IJ SOLN
2.0000 mg | Freq: Once | INTRAMUSCULAR | Status: AC
  Administered 2015-07-17: 2 mg via INTRAMUSCULAR
  Filled 2015-07-17: qty 1

## 2015-07-17 MED ORDER — OXYCODONE-ACETAMINOPHEN 5-325 MG PO TABS
1.0000 | ORAL_TABLET | Freq: Once | ORAL | Status: AC
  Administered 2015-07-17: 1 via ORAL
  Filled 2015-07-17: qty 1

## 2015-07-17 MED ORDER — IBUPROFEN 600 MG PO TABS: 600.0000 mg | ORAL_TABLET | Freq: Three times a day (TID) | ORAL | Status: DC | PRN

## 2015-07-17 MED ORDER — LORAZEPAM 2 MG/ML IJ SOLN
2.0000 mg | Freq: Once | INTRAMUSCULAR | Status: DC
Start: 2015-07-17 — End: 2015-07-17

## 2015-07-17 NOTE — ED Notes (Signed)
Pt fell earlier by slipping and landed on her right knee. Hurts to bear weight.

## 2015-07-17 NOTE — ED Notes (Signed)
Ortho returned call. On way to ED to see patient.

## 2015-07-17 NOTE — ED Notes (Signed)
Ortho paged. Patient will need crutches prior to discharge.

## 2015-07-17 NOTE — ED Notes (Signed)
Pt given a ice pack to placed on her rt knee.

## 2015-07-17 NOTE — ED Notes (Signed)
Patient transported to X-ray 
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
## 2015-08-30 ENCOUNTER — Encounter (HOSPITAL_COMMUNITY): Payer: Self-pay | Admitting: Emergency Medicine

## 2015-08-30 ENCOUNTER — Emergency Department (HOSPITAL_COMMUNITY): Payer: Self-pay

## 2015-08-30 DIAGNOSIS — F17210 Nicotine dependence, cigarettes, uncomplicated: Secondary | ICD-10-CM | POA: Insufficient documentation

## 2015-08-30 DIAGNOSIS — R61 Generalized hyperhidrosis: Secondary | ICD-10-CM | POA: Insufficient documentation

## 2015-08-30 DIAGNOSIS — J45901 Unspecified asthma with (acute) exacerbation: Secondary | ICD-10-CM | POA: Insufficient documentation

## 2015-08-30 DIAGNOSIS — R079 Chest pain, unspecified: Secondary | ICD-10-CM | POA: Insufficient documentation

## 2015-08-30 LAB — BASIC METABOLIC PANEL
ANION GAP: 9 (ref 5–15)
BUN: 8 mg/dL (ref 6–20)
CHLORIDE: 107 mmol/L (ref 101–111)
CO2: 23 mmol/L (ref 22–32)
Calcium: 9.9 mg/dL (ref 8.9–10.3)
Creatinine, Ser: 0.73 mg/dL (ref 0.44–1.00)
Glucose, Bld: 89 mg/dL (ref 65–99)
POTASSIUM: 4.6 mmol/L (ref 3.5–5.1)
SODIUM: 139 mmol/L (ref 135–145)

## 2015-08-30 LAB — I-STAT TROPONIN, ED: Troponin i, poc: 0 ng/mL (ref 0.00–0.08)

## 2015-08-30 LAB — CBC
HEMATOCRIT: 37.5 % (ref 36.0–46.0)
HEMOGLOBIN: 11.8 g/dL — AB (ref 12.0–15.0)
MCH: 24 pg — ABNORMAL LOW (ref 26.0–34.0)
MCHC: 31.5 g/dL (ref 30.0–36.0)
MCV: 76.2 fL — AB (ref 78.0–100.0)
Platelets: 235 10*3/uL (ref 150–400)
RBC: 4.92 MIL/uL (ref 3.87–5.11)
RDW: 13 % (ref 11.5–15.5)
WBC: 14.1 10*3/uL — AB (ref 4.0–10.5)

## 2015-08-30 NOTE — ED Notes (Signed)
Pt. reports central/left chest pain with SOB , dry cough and diaphoresis onset this evening , denies nausea or fever .

## 2015-08-31 ENCOUNTER — Emergency Department (HOSPITAL_COMMUNITY)
Admission: EM | Admit: 2015-08-31 | Discharge: 2015-08-31 | Disposition: A | Discharge status: Home / Self Care | Payer: Self-pay | Attending: Emergency Medicine | Admitting: Emergency Medicine

## 2015-08-31 NOTE — ED Notes (Signed)
Pts name called for a room no answer 

## 2015-10-07 ENCOUNTER — Emergency Department (HOSPITAL_COMMUNITY)
Admission: EM | Admit: 2015-10-07 | Discharge: 2015-10-07 | Disposition: A | Discharge status: Home / Self Care | Payer: No Typology Code available for payment source | Attending: Emergency Medicine | Admitting: Emergency Medicine

## 2015-10-07 ENCOUNTER — Encounter (HOSPITAL_COMMUNITY): Payer: Self-pay | Admitting: *Deleted

## 2015-10-07 DIAGNOSIS — R51 Headache: Secondary | ICD-10-CM | POA: Insufficient documentation

## 2015-10-07 DIAGNOSIS — Z79891 Long term (current) use of opiate analgesic: Secondary | ICD-10-CM | POA: Insufficient documentation

## 2015-10-07 DIAGNOSIS — R519 Headache, unspecified: Secondary | ICD-10-CM

## 2015-10-07 DIAGNOSIS — Z7982 Long term (current) use of aspirin: Secondary | ICD-10-CM | POA: Insufficient documentation

## 2015-10-07 DIAGNOSIS — J45909 Unspecified asthma, uncomplicated: Secondary | ICD-10-CM | POA: Insufficient documentation

## 2015-10-07 DIAGNOSIS — Z79899 Other long term (current) drug therapy: Secondary | ICD-10-CM | POA: Insufficient documentation

## 2015-10-07 DIAGNOSIS — Z87891 Personal history of nicotine dependence: Secondary | ICD-10-CM | POA: Insufficient documentation

## 2015-10-07 DIAGNOSIS — Z791 Long term (current) use of non-steroidal anti-inflammatories (NSAID): Secondary | ICD-10-CM | POA: Insufficient documentation

## 2015-10-07 MED ORDER — OXYCODONE-ACETAMINOPHEN 5-325 MG PO TABS: 1.0000 | ORAL_TABLET | ORAL | Status: DC | PRN

## 2015-10-07 MED ORDER — METOCLOPRAMIDE HCL 5 MG/ML IJ SOLN
10.0000 mg | Freq: Once | INTRAMUSCULAR | Status: AC
  Administered 2015-10-07: 10 mg via INTRAVENOUS
  Filled 2015-10-07: qty 2

## 2015-10-07 MED ORDER — DIPHENHYDRAMINE HCL 50 MG/ML IJ SOLN
25.0000 mg | Freq: Once | INTRAMUSCULAR | Status: AC
  Administered 2015-10-07: 25 mg via INTRAVENOUS
  Filled 2015-10-07: qty 1

## 2015-10-07 MED ORDER — SODIUM CHLORIDE 0.9 % IV BOLUS (SEPSIS)
1000.0000 mL | Freq: Once | INTRAVENOUS | Status: AC
  Administered 2015-10-07: 1000 mL via INTRAVENOUS

## 2015-10-07 MED ORDER — PROMETHAZINE HCL 25 MG PO TABS: 25.0000 mg | ORAL_TABLET | Freq: Four times a day (QID) | ORAL | Status: DC | PRN

## 2015-10-07 MED ORDER — KETOROLAC TROMETHAMINE 30 MG/ML IJ SOLN
30.0000 mg | Freq: Once | INTRAMUSCULAR | Status: AC
  Administered 2015-10-07: 30 mg via INTRAVENOUS
  Filled 2015-10-07: qty 1

## 2015-10-07 NOTE — ED Provider Notes (Signed)
CSN: [id number]     Arrival date & time 10/07/15  0700 History   First MD Initiated Contact with Patient 10/07/15 [phone]     Chief Complaint  Patient presents with  . Headache     (Consider location/radiation/quality/duration/timing/severity/associated sxs/prior Treatment) HPI....Marland KitchenMarland KitchenFrontal headache radiating to the lateral aspect of the neck bilaterally since last night with associated nausea and vomiting. Patient has had similar symptoms in the past. Review systems positive for photophobia. No stiff neck, fever, chills, neurodeficits. She has tried Excedrin at home with no relief. Severity is moderate. Nothing makes symptoms better or worse  Past Medical History  Diagnosis Date  . Asthma   . Anxiety   . Arrhythmia    Past Surgical History  Procedure Laterality Date  . Wrist ganglion excision    . Dilation and curettage of uterus     Family History  Problem Relation Age of Onset  . Hypertension Mother    Social History  Substance Use Topics  . Smoking status: Former Smoker    Types: Cigars    Quit date: 08/09/2015  . Smokeless tobacco: None  . Alcohol Use: Yes   OB History    No data available     Review of Systems  All other systems reviewed and are negative.     Allergies  Calamine  Home Medications   Prior to Admission medications   Medication Sig Start Date End Date Taking? Authorizing Provider  aspirin-acetaminophen-caffeine (EXCEDRIN MIGRAINE) [phone] MG tablet Take 1 tablet by mouth every 6 (six) hours as needed for headache.   Yes Historical Provider, MD  ibuprofen (ADVIL,MOTRIN) 600 MG tablet Take 1 tablet (600 mg total) by mouth every 8 (eight) hours as needed. Patient not taking: Reported on 10/07/2015 07/17/15   Ripley Fraise, MD  oxyCODONE-acetaminophen (PERCOCET/ROXICET) 5-325 MG tablet Take 1-2 tablets by mouth every 4 (four) hours as needed for severe pain. 10/07/15   Nat Christen, MD  promethazine (PHENERGAN) 25 MG tablet Take 1 tablet (25 mg  total) by mouth every 6 (six) hours as needed for nausea or vomiting. 10/07/15   Nat Christen, MD   BP 94/54 mmHg  Pulse 72  Temp(Src) 98.1 F (36.7 C) (Oral)  Resp 16  Ht 5\' 2"  (1.575 m)  Wt 193 lb (87.544 kg)  BMI 35.29 kg/m2  SpO2 100%  LMP 09/12/2015 Physical Exam  Constitutional: She is oriented to person, place, and time.  Photophobic  HENT:  Head: Normocephalic and atraumatic.  Eyes: Conjunctivae and EOM are normal. Pupils are equal, round, and reactive to light.  Neck: Normal range of motion. Neck supple.  Cardiovascular: Normal rate and regular rhythm.   Pulmonary/Chest: Effort normal and breath sounds normal.  Abdominal: Soft. Bowel sounds are normal.  Musculoskeletal: Normal range of motion.  Neurological: She is alert and oriented to person, place, and time.  Skin: Skin is warm and dry.  Psychiatric: She has a normal mood and affect. Her behavior is normal.  Nursing note and vitals reviewed.   ED Course  Procedures (including critical care time) Labs Review Labs Reviewed - No data to display  Imaging Review No results found. I have personally reviewed and evaluated these images and lab results as part of my medical decision-making.   EKG Interpretation None      MDM   Final diagnoses:  Acute intractable headache, unspecified headache type    No neuro deficits. Patient feels better after IV fluids, IV Toradol, Reglan,  Benadryl.   Discharge medications Percocet and  Phenergan 25 mg   Nat Christen, MD 10/07/15 [phone]

## 2015-10-07 NOTE — Discharge Instructions (Signed)
Increase fluids. Rest. Medication for pain and nausea.

## 2015-10-07 NOTE — ED Notes (Signed)
Patient states her headache is now relieved following medication administration.

## 2015-10-07 NOTE — ED Notes (Signed)
Reports headache since last night, states she has migraines, not on meds for migraines, c/o dizziness and nausea. Able to provide ride upon discharge

## 2015-10-07 NOTE — ED Notes (Signed)
Patient complains of headache since last night with nausea and vomiting x3 since last night.  Patient endorses photophobia and dizziness with headache.  Neuro exam unremarkable.  Upper and lower extremity strength equal bilaterally, light touch sensation grossly intact.  No dysmetria noted, PERRL 36mm, EOMI with no astigmatism.

## 2016-03-22 ENCOUNTER — Emergency Department (HOSPITAL_COMMUNITY)
Admission: EM | Admit: 2016-03-22 | Discharge: 2016-03-22 | Disposition: A | Discharge status: Home / Self Care | Payer: Self-pay | Attending: Emergency Medicine | Admitting: Emergency Medicine

## 2016-03-22 ENCOUNTER — Emergency Department (HOSPITAL_COMMUNITY): Payer: Self-pay

## 2016-03-22 ENCOUNTER — Encounter (HOSPITAL_COMMUNITY): Payer: Self-pay | Admitting: *Deleted

## 2016-03-22 DIAGNOSIS — Z7982 Long term (current) use of aspirin: Secondary | ICD-10-CM | POA: Insufficient documentation

## 2016-03-22 DIAGNOSIS — M545 Low back pain, unspecified: Secondary | ICD-10-CM

## 2016-03-22 DIAGNOSIS — J45909 Unspecified asthma, uncomplicated: Secondary | ICD-10-CM | POA: Insufficient documentation

## 2016-03-22 DIAGNOSIS — M722 Plantar fascial fibromatosis: Secondary | ICD-10-CM | POA: Insufficient documentation

## 2016-03-22 DIAGNOSIS — M79672 Pain in left foot: Secondary | ICD-10-CM

## 2016-03-22 DIAGNOSIS — Z87891 Personal history of nicotine dependence: Secondary | ICD-10-CM | POA: Insufficient documentation

## 2016-03-22 LAB — CBG MONITORING, ED: Glucose-Capillary: 85 mg/dL (ref 65–99)

## 2016-03-22 MED ORDER — NAPROXEN 500 MG PO TABS: 500.0000 mg | ORAL_TABLET | Freq: Two times a day (BID) | ORAL | 0 refills | Status: DC

## 2016-03-22 MED ORDER — IBUPROFEN 400 MG PO TABS
800.0000 mg | ORAL_TABLET | Freq: Once | ORAL | Status: AC
  Administered 2016-03-22: 800 mg via ORAL
  Filled 2016-03-22: qty 2

## 2016-03-22 NOTE — ED Provider Notes (Signed)
[facility] DEPT Provider Note   CSN: [id number] Arrival date & time: 03/22/16  1721  By signing my name below, I, Traci Rogers, attest that this documentation has been prepared under the direction and in the presence of Traci Quill, NP. Electronically Signed: Judithann Rogers, ED Scribe. 03/22/16. 6:12 PM.    History   Chief Complaint Chief Complaint  Patient presents with  . Leg Injury  . Back Pain    HPI Comments: Traci Rogers is a 30 y.o. female who presents to the Emergency Department complaining of gradually worsening moderate left foot pain that radiates up her left leg towards her lower back onset 2 weeks ago. She notes that the pain is concentrated to the lateral aspect of her foot and the heel of her foot, where it is the worse. She explains that her friend told her that she had a "mark" described as a healed puncture wound to the sole of her left heel. She reports associated pain with ambulating. No alleviating factors noted. She states that she has tried OTC pain medications with no relief. Pt has an allergy to Calamine. She denies any recent falls, injuries, or trauma but states that she has had to accommodate her current right knee pain. She denies a personal hx of diabetes or DVT but reports a family hx. She also denies any birth control use or that she is a current smoker. She states that she has had increase thirst and increase urination. She denies any fever, numbness/tingling in LLE, bladder/bowel incontinence, weakness, or any other symptoms.   No current PCP here.   The history is provided by the patient. No language interpreter was used.  Foot Pain  This is a new problem. The current episode started more than 1 week ago. The problem occurs rarely. The problem has been gradually worsening. Nothing aggravates the symptoms. Nothing relieves the symptoms. She has tried acetaminophen for the symptoms. The treatment provided no relief.    Past Medical  History:  Diagnosis Date  . Anxiety   . Arrhythmia   . Asthma     There are no active problems to display for this patient.   Past Surgical History:  Procedure Laterality Date  . DILATION AND CURETTAGE OF UTERUS    . WRIST GANGLION EXCISION      OB History    No data available       Home Medications    Prior to Admission medications   Medication Sig Start Date End Date Taking? Authorizing Provider  aspirin-acetaminophen-caffeine (EXCEDRIN MIGRAINE) [phone] MG tablet Take 1 tablet by mouth every 6 (six) hours as needed for headache.    Historical Provider, MD  ibuprofen (ADVIL,MOTRIN) 600 MG tablet Take 1 tablet (600 mg total) by mouth every 8 (eight) hours as needed. Patient not taking: Reported on 10/07/2015 07/17/15   Ripley Fraise, MD  oxyCODONE-acetaminophen (PERCOCET/ROXICET) 5-325 MG tablet Take 1-2 tablets by mouth every 4 (four) hours as needed for severe pain. 10/07/15   Nat Christen, MD  promethazine (PHENERGAN) 25 MG tablet Take 1 tablet (25 mg total) by mouth every 6 (six) hours as needed for nausea or vomiting. 10/07/15   Nat Christen, MD    Family History Family History  Problem Relation Age of Onset  . Hypertension Mother     Social History Social History  Substance Use Topics  . Smoking status: Former Smoker    Types: Cigars    Quit date: 08/09/2015  . Smokeless tobacco: Not on file  . Alcohol  use Yes     Allergies   Calamine   Review of Systems Review of Systems  Constitutional: Negative for fever.  Endocrine: Positive for polydipsia and polyuria.  Musculoskeletal: Positive for arthralgias and back pain.  Neurological: Negative for weakness and numbness.  All other systems reviewed and are negative.    Physical Exam Updated Vital Signs BP 137/100   Pulse 74   Temp 98.6 F (37 C) (Oral)   Resp 20   SpO2 100%   Physical Exam  Constitutional: She is oriented to person, place, and time. She appears well-developed and well-nourished. No  distress.  HENT:  Head: Normocephalic and atraumatic.  Eyes: Conjunctivae and EOM are normal.  Neck: Neck supple. No tracheal deviation present.  Cardiovascular: Normal rate.   Pulmonary/Chest: Effort normal. No respiratory distress.  Musculoskeletal: Normal range of motion. She exhibits tenderness.  Tenderness to the left heel and to the ball of her foot Achilles tendon tenderness as well on same side Moderate TTP to lower back   Neurological: She is alert and oriented to person, place, and time.  Skin: Skin is warm and dry.  Psychiatric: She has a normal mood and affect. Her behavior is normal.  Nursing note and vitals reviewed.    ED Treatments / Results  DIAGNOSTIC STUDIES: Oxygen Saturation is 100% on RA, normal by my interpretation.    COORDINATION OF CARE: 6:07 PM- Pt advised of plan for treatment and pt agrees. Pt will receive left foot x-ray for further evaluation.    Labs (all labs ordered are listed, but only abnormal results are displayed) Labs Reviewed - No data to display  EKG  EKG Interpretation None       Radiology No results found.  Procedures Procedures (including critical care time)  Medications Ordered in ED Medications - No data to display   Initial Impression / Assessment and Plan / ED Course  Traci Quill, NP has reviewed the triage vital signs and the nursing notes.  Pertinent labs & imaging results that were available during my care of the patient were reviewed by me and considered in my medical decision making (see chart for details).  Clinical Course   Patient X-Ray negative for obvious fracture or dislocation.  Suspect plantar fasciitis--conservative therapy recommended and discussed. Patient will be discharged home & is agreeable with above plan. Returns precautions discussed. Pt appears safe for discharge.  Patient with back pain.  No neurological deficits and normal neuro exam.  Patient is ambulatory.  No loss of bowel or bladder  control.  No concern for cauda equina.  No fever, night sweats, weight loss, h/o cancer, IVDA, no recent procedure to back. No urinary symptoms suggestive of UTI.  Supportive care and return precaution discussed. Appears safe for discharge at this time. Follow up as indicated in discharge paperwork.    Final Clinical Impressions(s) / ED Diagnoses   Final diagnoses:  Left foot pain  Plantar fasciitis, left  Episodic low back pain    New Prescriptions Discharge Medication List as of 03/22/2016  7:19 PM    START taking these medications   Details  naproxen (NAPROSYN) 500 MG tablet Take 1 tablet (500 mg total) by mouth 2 (two) times daily., Starting Thu 03/22/2016, Print      I personally performed the services described in this documentation, which was scribed in my presence. The recorded information has been reviewed and is accurate.    Traci Quill, NP 03/22/16 2002    Noemi Chapel, MD 03/24/16  0043  

## 2016-03-22 NOTE — ED Notes (Signed)
See np assessment

## 2016-03-22 NOTE — ED Triage Notes (Signed)
Pt reports left leg pain shoot to left lower back for two weeks. Pt states pain increases when she stands.

## 2016-05-20 ENCOUNTER — Emergency Department (HOSPITAL_COMMUNITY): Payer: Self-pay

## 2016-05-20 ENCOUNTER — Encounter (HOSPITAL_COMMUNITY): Payer: Self-pay | Admitting: *Deleted

## 2016-05-20 ENCOUNTER — Emergency Department (HOSPITAL_COMMUNITY)
Admission: EM | Admit: 2016-05-20 | Discharge: 2016-05-20 | Disposition: A | Discharge status: Home / Self Care | Payer: Self-pay | Attending: Emergency Medicine | Admitting: Emergency Medicine

## 2016-05-20 DIAGNOSIS — R1032 Left lower quadrant pain: Secondary | ICD-10-CM

## 2016-05-20 DIAGNOSIS — R112 Nausea with vomiting, unspecified: Secondary | ICD-10-CM

## 2016-05-20 DIAGNOSIS — Z79899 Other long term (current) drug therapy: Secondary | ICD-10-CM | POA: Insufficient documentation

## 2016-05-20 DIAGNOSIS — Z87891 Personal history of nicotine dependence: Secondary | ICD-10-CM | POA: Insufficient documentation

## 2016-05-20 DIAGNOSIS — R197 Diarrhea, unspecified: Secondary | ICD-10-CM

## 2016-05-20 DIAGNOSIS — J069 Acute upper respiratory infection, unspecified: Secondary | ICD-10-CM | POA: Insufficient documentation

## 2016-05-20 DIAGNOSIS — Z7982 Long term (current) use of aspirin: Secondary | ICD-10-CM | POA: Insufficient documentation

## 2016-05-20 DIAGNOSIS — J45909 Unspecified asthma, uncomplicated: Secondary | ICD-10-CM | POA: Insufficient documentation

## 2016-05-20 DIAGNOSIS — N12 Tubulo-interstitial nephritis, not specified as acute or chronic: Secondary | ICD-10-CM | POA: Insufficient documentation

## 2016-05-20 LAB — COMPREHENSIVE METABOLIC PANEL
ALBUMIN: 4.2 g/dL (ref 3.5–5.0)
ALT: 21 U/L (ref 14–54)
AST: 19 U/L (ref 15–41)
Alkaline Phosphatase: 80 U/L (ref 38–126)
Anion gap: 6 (ref 5–15)
BILIRUBIN TOTAL: 0.5 mg/dL (ref 0.3–1.2)
BUN: 8 mg/dL (ref 6–20)
CALCIUM: 9.9 mg/dL (ref 8.9–10.3)
CHLORIDE: 106 mmol/L (ref 101–111)
CO2: 27 mmol/L (ref 22–32)
CREATININE: 0.85 mg/dL (ref 0.44–1.00)
GFR calc non Af Amer: >60 mL/min (ref >60)
Glucose, Bld: 95 mg/dL (ref 65–99)
Potassium: 4.2 mmol/L (ref 3.5–5.1)
SODIUM: 139 mmol/L (ref 135–145)
Total Protein: 7.6 g/dL (ref 6.5–8.1)

## 2016-05-20 LAB — URINALYSIS, ROUTINE W REFLEX MICROSCOPIC
Bilirubin Urine: NEGATIVE
GLUCOSE, UA: NEGATIVE mg/dL
Ketones, ur: NEGATIVE mg/dL
Nitrite: POSITIVE — AB
PH: 6 (ref 5.0–8.0)
Protein, ur: 30 mg/dL — AB
SPECIFIC GRAVITY, URINE: 1.02 (ref 1.005–1.030)

## 2016-05-20 LAB — CBC
HCT: 37.7 % (ref 36.0–46.0)
Hemoglobin: 11.9 g/dL — ABNORMAL LOW (ref 12.0–15.0)
MCH: 24 pg — AB (ref 26.0–34.0)
MCHC: 31.6 g/dL (ref 30.0–36.0)
MCV: 76 fL — AB (ref 78.0–100.0)
PLATELETS: 254 10*3/uL (ref 150–400)
RBC: 4.96 MIL/uL (ref 3.87–5.11)
RDW: 13 % (ref 11.5–15.5)
WBC: 12.4 10*3/uL — AB (ref 4.0–10.5)

## 2016-05-20 LAB — I-STAT BETA HCG BLOOD, ED (MC, WL, AP ONLY): I-stat hCG, quantitative: <5 m[IU]/mL (ref <5)

## 2016-05-20 LAB — LIPASE, BLOOD: Lipase: 12 U/L (ref 11–51)

## 2016-05-20 MED ORDER — SODIUM CHLORIDE 0.9 % IV BOLUS (SEPSIS)
1000.0000 mL | Freq: Once | INTRAVENOUS | Status: AC
  Administered 2016-05-20: 1000 mL via INTRAVENOUS

## 2016-05-20 MED ORDER — ONDANSETRON HCL 4 MG/2ML IJ SOLN
4.0000 mg | Freq: Once | INTRAMUSCULAR | Status: AC
  Administered 2016-05-20: 4 mg via INTRAVENOUS
  Filled 2016-05-20: qty 2

## 2016-05-20 MED ORDER — CEPHALEXIN 500 MG PO CAPS: 500.0000 mg | ORAL_CAPSULE | Freq: Three times a day (TID) | ORAL | 0 refills | Status: AC

## 2016-05-20 MED ORDER — IBUPROFEN 600 MG PO TABS: 600.0000 mg | ORAL_TABLET | Freq: Four times a day (QID) | ORAL | 0 refills | Status: AC | PRN

## 2016-05-20 MED ORDER — ONDANSETRON 4 MG PO TBDP: 4.0000 mg | ORAL_TABLET | Freq: Three times a day (TID) | ORAL | 0 refills | Status: DC | PRN

## 2016-05-20 MED ORDER — ACETAMINOPHEN 325 MG PO TABS
650.0000 mg | ORAL_TABLET | Freq: Once | ORAL | Status: AC
Start: 2016-05-20 — End: 2016-05-20
  Administered 2016-05-20: 650 mg via ORAL
  Filled 2016-05-20: qty 2

## 2016-05-20 MED ORDER — KETOROLAC TROMETHAMINE 15 MG/ML IJ SOLN
15.0000 mg | Freq: Once | INTRAMUSCULAR | Status: AC
  Administered 2016-05-20: 15 mg via INTRAVENOUS
  Filled 2016-05-20: qty 1

## 2016-05-20 NOTE — ED Provider Notes (Signed)
Pt sen and evaluated.  D/W Resident.  Pt seen and examined.  ABD and flank pian with vomiting.  +UA c blood and wbcs.  Plan sx control and CT stone.    Tanna Furry, MD 05/20/16 [phone]

## 2016-05-20 NOTE — ED Triage Notes (Signed)
Pt reports lower abd pain with n/v/d. Has recent cough, reports chest pain when coughing and breathing. Pt reports blood in urine and denies vaginal discharge/itching.

## 2016-05-20 NOTE — ED Notes (Signed)
Pt ambulatory w/ steady gait to restroom. 

## 2016-05-20 NOTE — Discharge Instructions (Signed)
Return to the emergency department for worsening vomiting where you are unable to take by mouth fluids or your antibiotics, severely worsening abdominal pain, or other new or worsening symptoms.   A urine culture has been sent. If the results show you need to be on a different antibiotic you will be called with the results and further instructions.

## 2016-05-20 NOTE — ED Provider Notes (Signed)
[facility] DEPT Provider Note   CSN: [id number] Arrival date & time: 05/20/16  1352     History   Chief Complaint Chief Complaint  Patient presents with  . Abdominal Pain  . Emesis  . Cough    HPI Traci Rogers is a 30 y.o. female.  HPI Patient is a previously healthy 30 year old female who presents with 4 days of lower abdominal pain, nausea, vomiting, diarrhea. Patient reports she has had crampy left lower abdominal pain, with occasional sharp pains over the past 4 days. It is constant and is worse with urination. She has also noticed some hematuria described as passing small blood clots. Denies pain or burning with urination. She is not currently on her period. She has also had 2-3 episodes of nonbloody nonbilious emesis with last episode yesterday as well as a few episodes of loose brown stools. Denies melena or hematochezia. In addition, patient reports non-productive cough and congestion for the past 3-4 days. +Sick contacts, grandmother has URI symptoms.   Past Medical History:  Diagnosis Date  . Anxiety   . Arrhythmia   . Asthma     There are no active problems to display for this patient.   Past Surgical History:  Procedure Laterality Date  . DILATION AND CURETTAGE OF UTERUS    . WRIST GANGLION EXCISION      OB History    No data available       Home Medications    Prior to Admission medications   Medication Sig Start Date End Date Taking? Authorizing Provider  aspirin-acetaminophen-caffeine (EXCEDRIN MIGRAINE) [phone] MG tablet Take 1 tablet by mouth every 6 (six) hours as needed for headache.    Historical Provider, MD  cephALEXin (KEFLEX) 500 MG capsule Take 1 capsule (500 mg total) by mouth 3 (three) times daily. 05/20/16 05/30/16  Gibson Ramp, MD  ibuprofen (ADVIL,MOTRIN) 600 MG tablet Take 1 tablet (600 mg total) by mouth every 6 (six) hours as needed. 05/20/16 05/27/16  Gibson Ramp, MD  naproxen (NAPROSYN) 500 MG tablet Take 1 tablet  (500 mg total) by mouth 2 (two) times daily. 03/22/16   Etta Quill, NP  ondansetron (ZOFRAN ODT) 4 MG disintegrating tablet Take 1 tablet (4 mg total) by mouth every 8 (eight) hours as needed for nausea or vomiting. 05/20/16   Gibson Ramp, MD  oxyCODONE-acetaminophen (PERCOCET/ROXICET) 5-325 MG tablet Take 1-2 tablets by mouth every 4 (four) hours as needed for severe pain. 10/07/15   Nat Christen, MD  promethazine (PHENERGAN) 25 MG tablet Take 1 tablet (25 mg total) by mouth every 6 (six) hours as needed for nausea or vomiting. 10/07/15   Nat Christen, MD    Family History Family History  Problem Relation Age of Onset  . Hypertension Mother     Social History Social History  Substance Use Topics  . Smoking status: Former Smoker    Types: Cigars    Quit date: 08/09/2015  . Smokeless tobacco: Not on file  . Alcohol use Yes     Allergies   Calamine   Review of Systems Review of Systems  Constitutional: Positive for fatigue and fever (to 101 yesterday). Negative for chills.  HENT: Negative for ear pain and sore throat.   Eyes: Negative for pain and visual disturbance.  Respiratory: Positive for cough and wheezing. Negative for shortness of breath.   Cardiovascular: Positive for chest pain. Negative for palpitations.  Gastrointestinal: Positive for abdominal pain, diarrhea, nausea and vomiting.  Genitourinary: Positive for hematuria.  Negative for difficulty urinating, dysuria, flank pain and frequency.  Musculoskeletal: Negative for arthralgias and back pain.  Skin: Negative for color change and rash.  Neurological: Negative for seizures and syncope.  All other systems reviewed and are negative.    Physical Exam Updated Vital Signs BP 127/76   Pulse (!) 59   Temp 98.2 F (36.8 C) (Oral)   Resp 13   LMP 05/13/2016   SpO2 100%   Physical Exam  Constitutional: She is oriented to person, place, and time. She appears well-developed and well-nourished. No distress.  HENT:    Head: Normocephalic and atraumatic.  Eyes: EOM are normal. Pupils are equal, round, and reactive to light.  Neck: Normal range of motion. Neck supple.  Cardiovascular: Normal rate, regular rhythm and intact distal pulses.   No murmur heard. Pulmonary/Chest: Effort normal and breath sounds normal. No respiratory distress. She has no wheezes. She has no rales. She exhibits tenderness (Reproducible TTP over left upper and lower chest).  Dry cough on exam  Abdominal: Soft. She exhibits no distension. There is no tenderness. There is no rebound and no guarding.  Musculoskeletal: Normal range of motion. She exhibits no edema or tenderness.  Neurological: She is alert and oriented to person, place, and time.  Skin: Skin is warm and dry. No rash noted.  Psychiatric: She has a normal mood and affect.  Nursing note and vitals reviewed.    ED Treatments / Results  Labs (all labs ordered are listed, but only abnormal results are displayed) Labs Reviewed  CBC - Abnormal; Notable for the following:       Result Value   WBC 12.4 (*)    Hemoglobin 11.9 (*)    MCV 76.0 (*)    MCH 24.0 (*)    All other components within normal limits  URINALYSIS, ROUTINE W REFLEX MICROSCOPIC - Abnormal; Notable for the following:    APPearance CLOUDY (*)    Hgb urine dipstick MODERATE (*)    Protein, ur 30 (*)    Nitrite POSITIVE (*)    Leukocytes, UA MODERATE (*)    Bacteria, UA MANY (*)    Squamous Epithelial / LPF 0-5 (*)    All other components within normal limits  URINE CULTURE  LIPASE, BLOOD  COMPREHENSIVE METABOLIC PANEL  I-STAT BETA HCG BLOOD, ED ([facility], [facility], [facility] ONLY)    EKG  EKG Interpretation None       Radiology Ct Abdomen Pelvis Wo Contrast  Result Date: 05/20/2016 CLINICAL DATA:  LEFT side abdominal pain, nausea, vomiting, and diarrhea since last Wednesday, hematuria, blood clots in urine, history asthma, former smoker EXAM: CT ABDOMEN AND PELVIS WITHOUT CONTRAST TECHNIQUE:  Multidetector CT imaging of the abdomen and pelvis was performed following the standard protocol without IV contrast. Sagittal and coronal MPR images reconstructed from axial data set. Oral contrast was not administered. COMPARISON:  03/10/2015 FINDINGS: Lower chest: Tiny nodular focus of pleural thickening LEFT major fissure image 3 unchanged. Subpleural nodule RIGHT lung base image 1 unchanged. Minimal subsegmental atelectasis LEFT lower lobe. Hepatobiliary: Gallbladder and liver normal appearance Pancreas: Normal appearance Spleen: Normal appearance.  Question tiny adjacent splenule stable. Adrenals/Urinary Tract: Adrenal glands normal appearance. Kidneys, ureters, and bladder normal appearance. No urinary tract calcification, hydronephrosis or ureteral dilatation. Stomach/Bowel: Normal appendix. Stomach and bowel loops unremarkable for technique. Vascular/Lymphatic: Unremarkable Reproductive: Unremarkable Other: No free air or free fluid. No hernia or acute inflammatory process. Musculoskeletal: Normal appearance IMPRESSION: No acute intra- abdominal or intrapelvic abnormalities. Specifically,  no definite urinary tract abnormalities identified. Stable tiny nodular foci at the lung bases. Electronically Signed   By: Lavonia Dana M.D.   On: 05/20/2016 16:52   Dg Chest 2 View  Result Date: 05/20/2016 CLINICAL DATA:  Chest pain on the left for 2 or 3 days. EXAM: CHEST  2 VIEW COMPARISON:  August 30, 2015 FINDINGS: Stable mild cardiomegaly. The hila and mediastinum are normal. No pulmonary nodules, masses, or focal infiltrates. No overt edema. IMPRESSION: No active cardiopulmonary disease. Electronically Signed   By: Dorise Bullion III M.D   On: 05/20/2016 16:02    Procedures Procedures (including critical care time)  Medications Ordered in ED Medications  acetaminophen (TYLENOL) tablet 650 mg (not administered)  sodium chloride 0.9 % bolus 1,000 mL (1,000 mLs Intravenous New Bag/Given 05/20/16 1528)   ketorolac (TORADOL) 15 MG/ML injection 15 mg (15 mg Intravenous Given 05/20/16 1522)  ondansetron (ZOFRAN) injection 4 mg (4 mg Intravenous Given 05/20/16 1737)     Initial Impression / Assessment and Plan / ED Course  I have reviewed the triage vital signs and the nursing notes.  Pertinent labs & imaging results that were available during my care of the patient were reviewed by me and considered in my medical decision making (see chart for details).  Clinical Course    Patient is a 30 year old female with past history of asthma who presents with multiple complaints including 4 days of lower abdominal pain, nausea, vomiting and loose stools. Today she began to have hematuria which is why she presented to the emergency department. She also endorses URI symptoms.   On presentation, patient patient is afebrile, VSS. Abdominal exam with mild LLQ TTP, without peritoneal signs. No RUQ or RLQ TTP. No CVA tenderness. No wheezes or rales on auscultation. Chest discomfort is reproducible to palpation and associated with coughing.  EKG without acute ischemic changes, arrhythmias or other abnormalities. Chest x-ray negative for pneumonia, pneumothorax or pleural effusion. No hypoxia, tachycardia or increased work of breathing. Cough is likely due to viral URI.  Labs obtained in triage reviewed. Significant for leukocytosis. CMP unremarkable. Pregnancy negative. UA positive for nitrites, leuks, TNTC RBCs, TNTC WBCs. CT abd/pelvis without contrast ordered to rule out infected stone.  IV fluids and Toradol given after pregnancy test confirmed engative.   CT abdomen pelvis is negative for stone. Appendix is normal. No obstruction or other acute intrabdominal pathology identified.   Will treat patient for pyelonephritis as an outpatient. Prescription for Zofran and Keflex given. Advised follow-up with primary care doctor to 3 days for reevaluation symptoms persist. Advised that urine cultures are pending.  Strict return precautions given. Patient reports understanding and agreement with the plan. Discharged in stable condition.  Patient seen and discussed with Dr. Jeneen Rinks, ED attending   Clinical Impressions(s) / ED Diagnoses   Final diagnoses:  Nausea vomiting and diarrhea  Pyelonephritis  Upper respiratory tract infection, unspecified type    New Prescriptions New Prescriptions   CEPHALEXIN (KEFLEX) 500 MG CAPSULE    Take 1 capsule (500 mg total) by mouth 3 (three) times daily.   IBUPROFEN (ADVIL,MOTRIN) 600 MG TABLET    Take 1 tablet (600 mg total) by mouth every 6 (six) hours as needed.   ONDANSETRON (ZOFRAN ODT) 4 MG DISINTEGRATING TABLET    Take 1 tablet (4 mg total) by mouth every 8 (eight) hours as needed for nausea or vomiting.     Gibson Ramp, MD 05/21/16 0127    Tanna Furry, MD 05/27/16  1504  

## 2016-05-22 LAB — URINE CULTURE

## 2016-05-23 ENCOUNTER — Telehealth: Payer: Self-pay

## 2016-05-23 NOTE — Telephone Encounter (Signed)
Post ED Visit - Positive Culture Follow-up  Culture report reviewed by antimicrobial stewardship pharmacist:  [x]  Elenor Quinones, Pharm.D. []  Heide Guile, Pharm.D., BCPS []  Parks Neptune, Pharm.D. []  Alycia Rossetti, Pharm.D., BCPS []  [place], Pharm.D., BCPS, AAHIVP []  Legrand Como, Pharm.D., BCPS, AAHIVP []  Milus Glazier, Pharm.D. []  Rob Evette Doffing, Pharm.D.  Positive urine culture Treated with Cephalexin, organism sensitive to the same and no further patient follow-up is required at this time.  Genia Del 05/23/2016, 9:42 AM

## 2016-06-12 ENCOUNTER — Emergency Department (HOSPITAL_COMMUNITY)
Admission: EM | Admit: 2016-06-12 | Discharge: 2016-06-12 | Disposition: A | Discharge status: Home / Self Care | Payer: Self-pay | Attending: Emergency Medicine | Admitting: Emergency Medicine

## 2016-06-12 ENCOUNTER — Emergency Department (HOSPITAL_COMMUNITY): Payer: Self-pay

## 2016-06-12 ENCOUNTER — Encounter (HOSPITAL_COMMUNITY): Payer: Self-pay | Admitting: *Deleted

## 2016-06-12 DIAGNOSIS — B9789 Other viral agents as the cause of diseases classified elsewhere: Secondary | ICD-10-CM

## 2016-06-12 DIAGNOSIS — Z79899 Other long term (current) drug therapy: Secondary | ICD-10-CM | POA: Insufficient documentation

## 2016-06-12 DIAGNOSIS — J45909 Unspecified asthma, uncomplicated: Secondary | ICD-10-CM | POA: Insufficient documentation

## 2016-06-12 DIAGNOSIS — Z87891 Personal history of nicotine dependence: Secondary | ICD-10-CM | POA: Insufficient documentation

## 2016-06-12 DIAGNOSIS — Z7982 Long term (current) use of aspirin: Secondary | ICD-10-CM | POA: Insufficient documentation

## 2016-06-12 DIAGNOSIS — J069 Acute upper respiratory infection, unspecified: Secondary | ICD-10-CM | POA: Insufficient documentation

## 2016-06-12 HISTORY — DX: Insomnia, unspecified: G47.00

## 2016-06-12 NOTE — ED Provider Notes (Signed)
[facility] DEPT Provider Note   CSN: [id number] Arrival date & time: 06/12/16  1033  By signing my name below, I, Judithe Modest, attest that this documentation has been prepared under the direction and in the presence of Ludwig Clarks, MD. Electronically Signed: Judithe Modest, ER Scribe. 01/21/2016. 2:49 PM.  History   Chief Complaint Chief Complaint  Patient presents with  . Cough  . Sore Throat  . Otalgia  . Diarrhea   HPI  HPI Comments: Traci Rogers is a 31 y.o. female who presents to the Emergency Department complaining of two days of sinus tenderness and congestion, sore throat with associated difficulty swallowing, sleep disturbance, cough and fever (99.8). Her sx were worse on waking, but have improved over the course of the day. She did not take a flu shot this year.    Past Medical History:  Diagnosis Date  . Anxiety   . Arrhythmia   . Asthma   . Insomnia     There are no active problems to display for this patient.   Past Surgical History:  Procedure Laterality Date  . DILATION AND CURETTAGE OF UTERUS    . WRIST GANGLION EXCISION      OB History    No data available       Home Medications    Prior to Admission medications   Medication Sig Start Date End Date Taking? Authorizing Provider  aspirin-acetaminophen-caffeine (EXCEDRIN MIGRAINE) [phone] MG tablet Take 1 tablet by mouth every 6 (six) hours as needed for headache.    Historical Provider, MD  naproxen (NAPROSYN) 500 MG tablet Take 1 tablet (500 mg total) by mouth 2 (two) times daily. 03/22/16   Etta Quill, NP  ondansetron (ZOFRAN ODT) 4 MG disintegrating tablet Take 1 tablet (4 mg total) by mouth every 8 (eight) hours as needed for nausea or vomiting. 05/20/16   Gibson Ramp, MD  oxyCODONE-acetaminophen (PERCOCET/ROXICET) 5-325 MG tablet Take 1-2 tablets by mouth every 4 (four) hours as needed for severe pain. 10/07/15   Nat Christen, MD  promethazine (PHENERGAN) 25 MG tablet Take 1  tablet (25 mg total) by mouth every 6 (six) hours as needed for nausea or vomiting. 10/07/15   Nat Christen, MD    Family History Family History  Problem Relation Age of Onset  . Hypertension Mother     Social History Social History  Substance Use Topics  . Smoking status: Former Smoker    Types: Cigars    Quit date: 08/09/2015  . Smokeless tobacco: Never Used  . Alcohol use Yes     Allergies   Calamine   Review of Systems Review of Systems  Constitutional: Positive for fever. Negative for chills.  HENT: Positive for rhinorrhea, sinus pressure, sore throat and trouble swallowing.   Respiratory: Positive for cough.   All other systems reviewed and are negative.   Physical Exam Updated Vital Signs BP 114/80 (BP Location: Right Arm)   Pulse 75   Temp 98.8 F (37.1 C) (Oral)   Resp 18   Ht 5\' 2"  (1.575 m)   Wt 214 lb (97.1 kg)   LMP 06/05/2016   SpO2 100%   BMI 39.14 kg/m   Physical Exam  Constitutional: She is oriented to person, place, and time. She appears well-developed and well-nourished. No distress.  HENT:  Head: Normocephalic and atraumatic.  Mouth/Throat: Oropharynx is clear and moist.  Eyes: EOM are normal.  Neck: Normal range of motion.  Cardiovascular: Normal rate, regular rhythm and normal  heart sounds.  Exam reveals no gallop and no friction rub.   No murmur heard. Pulmonary/Chest: Effort normal and breath sounds normal. No respiratory distress. She has no wheezes.  Abdominal: Soft. She exhibits no distension. There is no tenderness.  Musculoskeletal: Normal range of motion.  Lymphadenopathy:    She has no cervical adenopathy.  Neurological: She is alert and oriented to person, place, and time.  Skin: Skin is warm and dry.  Psychiatric: She has a normal mood and affect. Judgment normal.  Nursing note and vitals reviewed.    ED Treatments / Results  DIAGNOSTIC STUDIES: Oxygen Saturation is 100% on RA, normal by my interpretation.     COORDINATION OF CARE: 2:44 PM Discussed treatment plan with pt at bedside and pt agreed to plan.  Labs (all labs ordered are listed, but only abnormal results are displayed) Labs Reviewed - No data to display  EKG  EKG Interpretation None       Radiology Dg Chest 2 View  Result Date: 06/12/2016 CLINICAL DATA:  Throat feels colon.  Cough for 2 days EXAM: CHEST  2 VIEW COMPARISON:  05/20/2016 FINDINGS: The heart size and mediastinal contours are within normal limits. Both lungs are clear. The visualized skeletal structures are unremarkable. IMPRESSION: No active cardiopulmonary disease. Electronically Signed   By: Kathreen Devoid   On: 06/12/2016 12:01    Procedures Procedures (including critical care time)  Medications Ordered in ED Medications - No data to display   Initial Impression / Assessment and Plan / ED Course  I have reviewed the triage vital signs and the nursing notes.  Pertinent labs & imaging results that were available during my care of the patient were reviewed by me and considered in my medical decision making (see chart for details).  Clinical Course     Symptoms most likely viral in nature. Will recommend over-the-counter medications and when necessary return.  Final Clinical Impressions(s) / ED Diagnoses   Final diagnoses:  None    New Prescriptions New Prescriptions   No medications on file     I personally performed the services described in this documentation, which was scribed in my presence. The recorded information has been reviewed and is accurate.           Veryl Speak, MD 06/12/16 2032

## 2016-06-12 NOTE — Discharge Instructions (Signed)
Ibuprofen 600 mg every 6 hours as needed for pain or fever.  Over-the-counter cough and cold medications as needed for symptomatic relief.  Return to the emergency department if you develop difficulty breathing, severe chest pains, or other new and concerning symptoms.

## 2017-01-08 ENCOUNTER — Encounter (HOSPITAL_BASED_OUTPATIENT_CLINIC_OR_DEPARTMENT_OTHER): Payer: Self-pay | Admitting: Emergency Medicine

## 2017-01-08 ENCOUNTER — Emergency Department (HOSPITAL_BASED_OUTPATIENT_CLINIC_OR_DEPARTMENT_OTHER)
Admission: EM | Admit: 2017-01-08 | Discharge: 2017-01-08 | Disposition: A | Discharge status: Home / Self Care | Payer: Self-pay | Attending: Emergency Medicine | Admitting: Emergency Medicine

## 2017-01-08 DIAGNOSIS — Z87891 Personal history of nicotine dependence: Secondary | ICD-10-CM | POA: Insufficient documentation

## 2017-01-08 DIAGNOSIS — R51 Headache: Secondary | ICD-10-CM | POA: Insufficient documentation

## 2017-01-08 DIAGNOSIS — R110 Nausea: Secondary | ICD-10-CM | POA: Insufficient documentation

## 2017-01-08 DIAGNOSIS — R519 Headache, unspecified: Secondary | ICD-10-CM

## 2017-01-08 DIAGNOSIS — J45909 Unspecified asthma, uncomplicated: Secondary | ICD-10-CM | POA: Insufficient documentation

## 2017-01-08 MED ORDER — METOCLOPRAMIDE HCL 10 MG PO TABS
10.0000 mg | ORAL_TABLET | Freq: Once | ORAL | Status: AC
  Administered 2017-01-08: 10 mg via ORAL
  Filled 2017-01-08: qty 1

## 2017-01-08 MED ORDER — IBUPROFEN 800 MG PO TABS
800.0000 mg | ORAL_TABLET | Freq: Once | ORAL | Status: AC
  Administered 2017-01-08: 800 mg via ORAL
  Filled 2017-01-08: qty 1

## 2017-01-08 MED ORDER — DIPHENHYDRAMINE HCL 25 MG PO CAPS
25.0000 mg | ORAL_CAPSULE | Freq: Once | ORAL | Status: AC
  Administered 2017-01-08: 25 mg via ORAL
  Filled 2017-01-08: qty 1

## 2017-01-08 NOTE — ED Notes (Signed)
Pt verbalizes understanding of d/c instructions and denies any further needs at this time. 

## 2017-01-08 NOTE — ED Triage Notes (Signed)
Pt c/o headache since yesterday.

## 2017-01-08 NOTE — ED Provider Notes (Signed)
[facility] DEPT [facility] Provider Note   CSN: [id number] Arrival date & time: 01/08/17  0345     History   Chief Complaint Chief Complaint  Patient presents with  . Headache    HPI Traci Rogers is a 31 y.o. female.  Patient presents with 24 hours of gradual onset headache. States diagnosed with migraines in the past. 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 She last took ibuprofen yesterday morning. She endorses photophobia and nausea but no vomiting. No fever. No runny nose or sore throat. No focal weakness, numbness or tingling. No double vision or blurry vision. No chest pain or shortness of breath.   The history is provided by the patient.  Headache   Associated symptoms include nausea. Pertinent negatives include no fever, no shortness of breath and no vomiting.    Past Medical History:  Diagnosis Date  . Anxiety   . Arrhythmia   . Asthma   . Insomnia     There are no active problems to display for this patient.   Past Surgical History:  Procedure Laterality Date  . DILATION AND CURETTAGE OF UTERUS    . WRIST GANGLION EXCISION      OB History    No data available       Home Medications    Prior to Admission medications   Medication Sig Start Date End Date Taking? Authorizing Provider  aspirin-acetaminophen-caffeine (EXCEDRIN MIGRAINE) [phone] MG tablet Take 1 tablet by mouth every 6 (six) hours as needed for headache.    [provider]  naproxen (NAPROSYN) 500 MG tablet Take 1 tablet (500 mg total) by mouth 2 (two) times daily. 03/22/16   Etta Quill, NP  ondansetron (ZOFRAN ODT) 4 MG disintegrating tablet Take 1 tablet (4 mg total) by mouth every 8 (eight) hours as needed for nausea or vomiting. 05/20/16   Gibson Ramp, MD  oxyCODONE-acetaminophen (PERCOCET/ROXICET) 5-325 MG tablet Take 1-2 tablets by mouth every 4 (four) hours as needed for severe pain. 10/07/15   Nat Christen, MD    promethazine (PHENERGAN) 25 MG tablet Take 1 tablet (25 mg total) by mouth every 6 (six) hours as needed for nausea or vomiting. 10/07/15   Nat Christen, MD    Family History Family History  Problem Relation Age of Onset  . Hypertension Mother     Social History Social History  Substance Use Topics  . Smoking status: Former Smoker    Types: Cigars    Quit date: 08/09/2015  . Smokeless tobacco: Never Used  . Alcohol use Yes     Allergies   Calamine   Review of Systems Review of Systems  Constitutional: Negative for activity change, appetite change and fever.  HENT: Negative for congestion, rhinorrhea and sore throat.   Eyes: Positive for photophobia.  Respiratory: Negative for cough, chest tightness and shortness of breath.   Cardiovascular: Negative for chest pain.  Gastrointestinal: Positive for nausea. Negative for abdominal pain and vomiting.  Genitourinary: Negative for dysuria, hematuria, vaginal bleeding and vaginal discharge.  Musculoskeletal: Negative for arthralgias and myalgias.  Skin: Negative for rash.  Neurological: Positive for headaches. Negative for dizziness, weakness and numbness.    all other systems are negative except as noted in the HPI and PMH.    Physical Exam Updated Vital Signs BP 127/90   Pulse 76   Temp 99.1 F (37.3 C) (Oral)   Resp 16   Ht 5\' 2"  (1.575  m)   Wt 96.6 kg (213 lb)   SpO2 100%   BMI 38.96 kg/m   Physical Exam  Constitutional: She is oriented to person, place, and time. She appears well-developed and well-nourished. No distress.  HENT:  Head: Normocephalic and atraumatic.  Mouth/Throat: Oropharynx is clear and moist. No oropharyngeal exudate.  No temporal artery tenderness  Eyes: Pupils are equal, round, and reactive to light. Conjunctivae and EOM are normal.  Neck: Normal range of motion. Neck supple.  No meningismus.  Cardiovascular: Normal rate, regular rhythm, normal heart sounds and intact distal pulses.   No  murmur heard. Pulmonary/Chest: Effort normal and breath sounds normal. No respiratory distress.  Abdominal: Soft. There is no tenderness. There is no rebound and no guarding.  Musculoskeletal: Normal range of motion. She exhibits no edema or tenderness.  Neurological: She is alert and oriented to person, place, and time. No cranial nerve deficit. She exhibits normal muscle tone. Coordination normal.  CN 2-12 intact, no ataxia on finger to nose, no nystagmus, 5/5 strength throughout, no pronator drift, Romberg negative, normal gait.   Skin: Skin is warm. Capillary refill takes less than 2 seconds.  Psychiatric: She has a normal mood and affect. Her behavior is normal.  Nursing note and vitals reviewed.    ED Treatments / Results  Labs (all labs ordered are listed, but only abnormal results are displayed) Labs Reviewed - No data to display  EKG  EKG Interpretation None       Radiology No results found.  Procedures Procedures (including critical care time)  Medications Ordered in ED Medications  ibuprofen (ADVIL,MOTRIN) tablet 800 mg (not administered)  metoCLOPramide (REGLAN) tablet 10 mg (not administered)  diphenhydrAMINE (BENADRYL) capsule 25 mg (not administered)     Initial Impression / Assessment and Plan / ED Course  I have reviewed the triage vital signs and the nursing notes.  Pertinent labs & imaging results that were available during my care of the patient were reviewed by me and considered in my medical decision making (see chart for details).     Gradual onset headache similar to previous migraines. No thunderclap onset. No focal neurological deficits.  Patient declines IM medications and wishes for by mouth only.  Low suspicion for subarachnoid hemorrhage, meningitis or temporal arteritis.  Patient feels improved on recheck. She is sleeping comfortably with her significant other in the bed. She is tolerating by mouth and ambulatory. Follow-up with PCP.  Return precautions discussed.  Final Clinical Impressions(s) / ED Diagnoses   Final diagnoses:  Headache, unspecified headache type    New Prescriptions New Prescriptions   No medications on file     Ezequiel Essex, MD 01/08/17 [phone]

## 2017-03-26 ENCOUNTER — Emergency Department (HOSPITAL_COMMUNITY): Payer: Self-pay

## 2017-03-26 ENCOUNTER — Emergency Department (HOSPITAL_COMMUNITY)
Admission: EM | Admit: 2017-03-26 | Discharge: 2017-03-26 | Disposition: A | Discharge status: Home / Self Care | Payer: Self-pay | Attending: Emergency Medicine | Admitting: Emergency Medicine

## 2017-03-26 ENCOUNTER — Encounter (HOSPITAL_COMMUNITY): Payer: Self-pay | Admitting: Emergency Medicine

## 2017-03-26 DIAGNOSIS — B349 Viral infection, unspecified: Secondary | ICD-10-CM | POA: Insufficient documentation

## 2017-03-26 DIAGNOSIS — J45909 Unspecified asthma, uncomplicated: Secondary | ICD-10-CM | POA: Insufficient documentation

## 2017-03-26 DIAGNOSIS — Z79899 Other long term (current) drug therapy: Secondary | ICD-10-CM | POA: Insufficient documentation

## 2017-03-26 DIAGNOSIS — Z87891 Personal history of nicotine dependence: Secondary | ICD-10-CM | POA: Insufficient documentation

## 2017-03-26 LAB — I-STAT BETA HCG BLOOD, ED (MC, WL, AP ONLY)

## 2017-03-26 MED ORDER — GUAIFENESIN 100 MG/5ML PO LIQD: 100.0000 mg | Freq: Four times a day (QID) | ORAL | 0 refills | Status: DC | PRN

## 2017-03-26 MED ORDER — BENZONATATE 100 MG PO CAPS: 100.0000 mg | ORAL_CAPSULE | Freq: Three times a day (TID) | ORAL | 0 refills | Status: DC

## 2017-03-26 NOTE — ED Triage Notes (Signed)
Patient presents with multiple complaints : productive cough , chest congestion/nasal congestion , fatigue, emesis and gen . body aches onset 2 days ago .

## 2017-03-26 NOTE — ED Provider Notes (Signed)
[facility] [facility] EMERGENCY DEPARTMENT Provider Note   CSN: [id number] Arrival date & time: 03/26/17  0417     History   Chief Complaint Chief Complaint  Patient presents with  . Cough  . Nasal Congestion    HPI Traci Rogers is a 31 y.o. female.  HPI   31 years old female with hx of asthma, anxiety presenting with cold sxs.  Patient report for the past 2 days she has had subjective fever, chills, headache, nasal congestion, nonproductive cough, abdominal discomfort, has vomited several times of "red stuff". She tries drinking tea to help with her symptoms with minimal improvement. She reported recent sick contact with a coworker with similar symptoms. She has not had a flu shot.She is currently actively trying to get pregnant. Her last menstrual period was 02/20/17. She was sent to the ER from work for further evaluation. No report of neck stiffness, dysuria, or rash. She is nonsmoker, and occasional drinker.      Past Medical History:  Diagnosis Date  . Anxiety   . Arrhythmia   . Asthma   . Insomnia     There are no active problems to display for this patient.   Past Surgical History:  Procedure Laterality Date  . DILATION AND CURETTAGE OF UTERUS    . WRIST GANGLION EXCISION      OB History    No data available       Home Medications    Prior to Admission medications   Medication Sig Start Date End Date Taking? Authorizing Provider  aspirin-acetaminophen-caffeine (EXCEDRIN MIGRAINE) [phone] MG tablet Take 1 tablet by mouth every 6 (six) hours as needed for headache.    [provider]  naproxen (NAPROSYN) 500 MG tablet Take 1 tablet (500 mg total) by mouth 2 (two) times daily. 03/22/16   Etta Quill, NP  ondansetron (ZOFRAN ODT) 4 MG disintegrating tablet Take 1 tablet (4 mg total) by mouth every 8 (eight) hours as needed for nausea or vomiting. 05/20/16   Gibson Ramp, MD  oxyCODONE-acetaminophen (PERCOCET/ROXICET) 5-325 MG  tablet Take 1-2 tablets by mouth every 4 (four) hours as needed for severe pain. 10/07/15   Nat Christen, MD  promethazine (PHENERGAN) 25 MG tablet Take 1 tablet (25 mg total) by mouth every 6 (six) hours as needed for nausea or vomiting. 10/07/15   Nat Christen, MD    Family History Family History  Problem Relation Age of Onset  . Hypertension Mother     Social History Social History  Substance Use Topics  . Smoking status: Former Smoker    Types: Cigars    Quit date: 08/09/2015  . Smokeless tobacco: Never Used  . Alcohol use Yes     Allergies   Calamine   Review of Systems Review of Systems  All other systems reviewed and are negative.    Physical Exam Updated Vital Signs BP 135/88 (BP Location: Left Arm)   Pulse 78   Temp 98.4 F (36.9 C) (Oral)   Resp 20   Ht 5\' 2"  (1.575 m)   Wt 97.5 kg (215 lb)   LMP 02/26/2017   SpO2 100%   BMI 39.32 kg/m   Physical Exam  Constitutional: She appears well-developed and well-nourished. No distress.  HENT:  Head: Atraumatic.  Right Ear: External ear normal.  Left Ear: External ear normal.  Nose: Nose normal.  Mouth/Throat: Oropharynx is clear and moist.  Ears: TMs are normal bilaterally Nose: Normal nares Throat: Uvula is midline, mild  posterior oropharyngeal erythema without tonsillar enlargement or exudates. No trismus  Eyes: Conjunctivae are normal.  Neck: Neck supple. No tracheal deviation present. No thyromegaly present.  No nuchal rigidity  Cardiovascular: Normal rate and regular rhythm.   Pulmonary/Chest: Effort normal and breath sounds normal. No respiratory distress. She has no wheezes. She has no rales.  Abdominal: Soft. She exhibits no distension. There is no tenderness.  Lymphadenopathy:    She has no cervical adenopathy.  Neurological: She is alert.  Skin: No rash noted.  Psychiatric: She has a normal mood and affect.  Nursing note and vitals reviewed.    ED Treatments / Results  Labs (all labs  ordered are listed, but only abnormal results are displayed) Labs Reviewed  I-STAT BETA HCG BLOOD, ED ([facility], [facility], [facility] ONLY)    EKG  EKG Interpretation None       Radiology Dg Chest 2 View  Result Date: 03/26/2017 CLINICAL DATA:  Productive cough, chest congestion and nasal congestion, fatigue, emesis, and generalized body aches for 2 days. EXAM: CHEST  2 VIEW COMPARISON:  06/12/2016 FINDINGS: The heart size and mediastinal contours are within normal limits. Both lungs are clear. The visualized skeletal structures are unremarkable. IMPRESSION: No active cardiopulmonary disease. Electronically Signed   By: Lucienne Capers M.D.   On: 03/26/2017 05:33    Procedures Procedures (including critical care time)  Medications Ordered in ED Medications - No data to display   Initial Impression / Assessment and Plan / ED Course  I have reviewed the triage vital signs and the nursing notes.  Pertinent labs & imaging results that were available during my care of the patient were reviewed by me and considered in my medical decision making (see chart for details).     BP 135/88 (BP Location: Left Arm)   Pulse 78   Temp 98.4 F (36.9 C) (Oral)   Resp 20   Ht 5\' 2"  (1.575 m)   Wt 97.5 kg (215 lb)   LMP 02/26/2017   SpO2 100%   BMI 39.32 kg/m    Final Clinical Impressions(s) / ED Diagnoses   Final diagnoses:  Viral illness    New Prescriptions New Prescriptions   No medications on file   Pt symptoms consistent with URI. CXR negative for acute infiltrate. Pt will be discharged with symptomatic treatment.  Discussed return precautions.  Pt is hemodynamically stable & in NAD prior to discharge.    Domenic Moras, PA-C 03/26/17 8657    Merryl Hacker, MD 03/26/17 [phone]

## 2017-05-26 ENCOUNTER — Emergency Department (HOSPITAL_BASED_OUTPATIENT_CLINIC_OR_DEPARTMENT_OTHER): Payer: Self-pay

## 2017-05-26 ENCOUNTER — Encounter (HOSPITAL_BASED_OUTPATIENT_CLINIC_OR_DEPARTMENT_OTHER): Payer: Self-pay | Admitting: *Deleted

## 2017-05-26 ENCOUNTER — Other Ambulatory Visit: Payer: Self-pay

## 2017-05-26 ENCOUNTER — Emergency Department (HOSPITAL_BASED_OUTPATIENT_CLINIC_OR_DEPARTMENT_OTHER)
Admission: EM | Admit: 2017-05-26 | Discharge: 2017-05-26 | Disposition: A | Discharge status: Home / Self Care | Payer: Self-pay | Attending: Physician Assistant | Admitting: Physician Assistant

## 2017-05-26 DIAGNOSIS — J45909 Unspecified asthma, uncomplicated: Secondary | ICD-10-CM | POA: Insufficient documentation

## 2017-05-26 DIAGNOSIS — Y9389 Activity, other specified: Secondary | ICD-10-CM | POA: Insufficient documentation

## 2017-05-26 DIAGNOSIS — Y999 Unspecified external cause status: Secondary | ICD-10-CM | POA: Insufficient documentation

## 2017-05-26 DIAGNOSIS — W208XXA Other cause of strike by thrown, projected or falling object, initial encounter: Secondary | ICD-10-CM | POA: Insufficient documentation

## 2017-05-26 DIAGNOSIS — Y929 Unspecified place or not applicable: Secondary | ICD-10-CM | POA: Insufficient documentation

## 2017-05-26 DIAGNOSIS — Z79899 Other long term (current) drug therapy: Secondary | ICD-10-CM | POA: Insufficient documentation

## 2017-05-26 DIAGNOSIS — Z87891 Personal history of nicotine dependence: Secondary | ICD-10-CM | POA: Insufficient documentation

## 2017-05-26 DIAGNOSIS — S60211A Contusion of right wrist, initial encounter: Secondary | ICD-10-CM | POA: Insufficient documentation

## 2017-05-26 MED ORDER — MORPHINE SULFATE (PF) 2 MG/ML IV SOLN
2.0000 mg | Freq: Once | INTRAVENOUS | Status: AC
  Administered 2017-05-26: 2 mg via INTRAMUSCULAR
  Filled 2017-05-26: qty 1

## 2017-05-26 MED ORDER — IBUPROFEN 600 MG PO TABS: 600.0000 mg | ORAL_TABLET | Freq: Four times a day (QID) | ORAL | 0 refills | Status: DC | PRN

## 2017-05-26 MED ORDER — OXYCODONE HCL 5 MG PO TABS: 10.0000 mg | ORAL_TABLET | Freq: Once | ORAL | Status: DC

## 2017-05-26 NOTE — Discharge Instructions (Signed)
You were seen today in the emergency department after an injury to your hand and wrist.  Fortunately you do not have any fractures in your hand, wrist or the distal part of your arm.  We have provided you with a splint to wear to avoid any damaging movements of your wrist.  We would recommend ibuprofen and ice 2-3 times a day for 20 minutes.  It would be a good idea for you to follow-up with your primary doctor or Dr. Karlton Lemon (sports medicine physician) if your symptoms not improve over the next couple of weeks.

## 2017-05-26 NOTE — ED Triage Notes (Signed)
Pt reports she injured her right hand while attempting to load tire on her car. Tire fell on her hand

## 2017-05-26 NOTE — ED Provider Notes (Signed)
[facility] EMERGENCY DEPARTMENT Provider Note   CSN: [id number] Arrival date & time: 05/26/17  1437  History   Chief Complaint Chief Complaint  Patient presents with  . Hand Injury   HPI Traci Rogers is a 31 y.o. female.  Patient is a 31 year old female presenting with acute right hand and wrist pain after a tire fell on her hand.  Patient was changing a tire and placing the spare back up on the rear of the vehicle when it fell and crushed her hand.  She reported immediate swelling and numbness and tingling.  She denies any pain in her arm but feels diffuse pain in her wrist and hand.  She has no prior trauma to the area.  She denies any fevers, chills, nausea, vomiting or diarrhea.      Past Medical History:  Diagnosis Date  . Anxiety   . Arrhythmia   . Asthma   . Insomnia     There are no active problems to display for this patient.   Past Surgical History:  Procedure Laterality Date  . DILATION AND CURETTAGE OF UTERUS    . WRIST GANGLION EXCISION      OB History    No data available       Home Medications    Prior to Admission medications   Medication Sig Start Date End Date Taking? Authorizing Provider  aspirin-acetaminophen-caffeine (EXCEDRIN MIGRAINE) [phone] MG tablet Take 1 tablet by mouth every 6 (six) hours as needed for headache.   Yes [provider]  benzonatate (TESSALON) 100 MG capsule Take 1 capsule (100 mg total) by mouth every 8 (eight) hours. 03/26/17   Domenic Moras, PA-C  guaiFENesin (ROBITUSSIN) 100 MG/5ML liquid Take 5-10 mLs (100-200 mg total) by mouth 4 (four) times daily as needed for congestion. 03/26/17   Domenic Moras, PA-C  ibuprofen (ADVIL,MOTRIN) 600 MG tablet Take 1 tablet (600 mg total) by mouth every 6 (six) hours as needed. 05/26/17   Eloise Levels, MD  naproxen (NAPROSYN) 500 MG tablet Take 1 tablet (500 mg total) by mouth 2 (two) times daily. 03/22/16   Etta Quill, NP  ondansetron (ZOFRAN ODT) 4  MG disintegrating tablet Take 1 tablet (4 mg total) by mouth every 8 (eight) hours as needed for nausea or vomiting. 05/20/16   Gibson Ramp, MD  oxyCODONE-acetaminophen (PERCOCET/ROXICET) 5-325 MG tablet Take 1-2 tablets by mouth every 4 (four) hours as needed for severe pain. 10/07/15   Nat Christen, MD  promethazine (PHENERGAN) 25 MG tablet Take 1 tablet (25 mg total) by mouth every 6 (six) hours as needed for nausea or vomiting. 10/07/15   Nat Christen, MD    Family History Family History  Problem Relation Age of Onset  . Hypertension Mother     Social History Social History   Tobacco Use  . Smoking status: Former Smoker    Types: Cigars    Last attempt to quit: 08/09/2015    Years since quitting: 1.7  . Smokeless tobacco: Never Used  Substance Use Topics  . Alcohol use: Yes  . Drug use: No     Allergies   Calamine   Review of Systems Review of Systems  HENT: Negative.   Eyes: Negative.   Respiratory: Negative.   Cardiovascular: Negative.   Musculoskeletal: Positive for arthralgias.  Skin: Negative for color change, pallor and rash.  Neurological: Positive for weakness and numbness.     Physical Exam Updated Vital Signs BP 122/74 (BP Location: Left Arm)  Pulse 76   Temp 98.3 F (36.8 C) (Oral)   Resp 18   LMP 05/25/2017   SpO2 100%   Physical Exam  Constitutional: She appears well-developed and well-nourished. No distress.  HENT:  Head: Normocephalic and atraumatic.  Eyes: Conjunctivae are normal.  Neck: Neck supple.  Cardiovascular: Normal rate and regular rhythm.  No murmur heard. Pulmonary/Chest: Effort normal and breath sounds normal. No respiratory distress.  Abdominal: Soft. There is no tenderness.  Musculoskeletal: She exhibits tenderness. She exhibits no edema or deformity.  Diffuse tenderness without edema, erythema or deformity in right hand and wrist.  Patient is exquisitely tender diffusely.  Possibly some mild edema just proximal to the  wrist on the radial side.  She has limited range of motion of the wrist and fingers on the right side secondary to pain however is able to move her fingers. she is able to make a fist on the right side.   Neurological: She is alert.  Skin: Skin is warm and dry.  Psychiatric: She has a normal mood and affect.  Nursing note and vitals reviewed.    ED Treatments / Results  Labs (all labs ordered are listed, but only abnormal results are displayed) Labs Reviewed - No data to display  EKG  EKG Interpretation None       Radiology Dg Forearm Right  Result Date: 05/26/2017 CLINICAL DATA:  Pain after trauma EXAM: RIGHT FOREARM - 2 VIEW COMPARISON:  None. FINDINGS: There is no evidence of fracture or other focal bone lesions. Soft tissues are unremarkable. IMPRESSION: Negative. Electronically Signed   By: Dorise Bullion III M.D   On: 05/26/2017 16:57   Dg Wrist Complete Right  Result Date: 05/26/2017 CLINICAL DATA:  Crush injury, pain EXAM: RIGHT WRIST - COMPLETE 3+ VIEW COMPARISON:  None. FINDINGS: There is no evidence of fracture or dislocation. There is no evidence of arthropathy or other focal bone abnormality. Soft tissues are unremarkable. IMPRESSION: Negative. Electronically Signed   By: Rolm Baptise M.D.   On: 05/26/2017 16:12   Dg Hand Complete Right  Result Date: 05/26/2017 CLINICAL DATA:  Crush injury by tire.  Pain, swelling EXAM: RIGHT HAND - COMPLETE 3+ VIEW COMPARISON:  None. FINDINGS: There is no evidence of fracture or dislocation. There is no evidence of arthropathy or other focal bone abnormality. Soft tissues are unremarkable. IMPRESSION: Negative. Electronically Signed   By: Rolm Baptise M.D.   On: 05/26/2017 16:11    Procedures Procedures (including critical care time)  Medications Ordered in ED Medications  morphine 2 MG/ML injection 2 mg (2 mg Intramuscular Given 05/26/17 1535)     Initial Impression / Assessment and Plan / ED Course  I have reviewed the  triage vital signs and the nursing notes.  Pertinent labs & imaging results that were available during my care of the patient were reviewed by me and considered in my medical decision making (see chart for details).  Patient is a 31 year old female presenting after a tire fell on her right hand after replacing the spare on the rear of her vehicle.  She does have significant tenderness to palpation diffusely the hand and wrist on the right side.  She was given 2 mg of morphine just prior to x-ray which showed no acute fractures or deformities.  She does have limited range of motion secondary to pain of the hand and wrist however is able to move her fingers, pulses 2+ in the radial pulses and sensation was intact.  Most  likely this is a contusion of her right wrist and will be self-limiting.  Placed in a thumb spica.  Recommended ibuprofen as needed, ice 2-3 times daily for 20 minutes.  Patient can follow-up with her primary doctor if this is not improved in the next couple of weeks for follow-up with sports medicine.  Provided patient with contact for Dr. Barbaraann Barthel.  Return precautions discussed.     Final Clinical Impressions(s) / ED Diagnoses   Final diagnoses:  Contusion of right wrist, initial encounter    ED Discharge Orders        Ordered    ibuprofen (ADVIL,MOTRIN) 600 MG tablet  Every 6 hours PRN,   Status:  Discontinued     05/26/17 1716    ibuprofen (ADVIL,MOTRIN) 600 MG tablet  Every 6 hours PRN     05/26/17 1717       Eloise Levels, MD 05/26/17 1815    Macarthur Critchley, MD 05/28/17 [phone]

## 2017-07-14 ENCOUNTER — Other Ambulatory Visit: Payer: Self-pay

## 2017-07-14 ENCOUNTER — Encounter (HOSPITAL_BASED_OUTPATIENT_CLINIC_OR_DEPARTMENT_OTHER): Payer: Self-pay | Admitting: *Deleted

## 2017-07-14 ENCOUNTER — Emergency Department (HOSPITAL_BASED_OUTPATIENT_CLINIC_OR_DEPARTMENT_OTHER): Payer: Self-pay

## 2017-07-14 ENCOUNTER — Emergency Department (HOSPITAL_BASED_OUTPATIENT_CLINIC_OR_DEPARTMENT_OTHER)
Admission: EM | Admit: 2017-07-14 | Discharge: 2017-07-14 | Disposition: A | Discharge status: Home / Self Care | Payer: Self-pay | Attending: Emergency Medicine | Admitting: Emergency Medicine

## 2017-07-14 DIAGNOSIS — Y998 Other external cause status: Secondary | ICD-10-CM | POA: Insufficient documentation

## 2017-07-14 DIAGNOSIS — W000XXA Fall on same level due to ice and snow, initial encounter: Secondary | ICD-10-CM | POA: Insufficient documentation

## 2017-07-14 DIAGNOSIS — Y929 Unspecified place or not applicable: Secondary | ICD-10-CM | POA: Insufficient documentation

## 2017-07-14 DIAGNOSIS — Y9389 Activity, other specified: Secondary | ICD-10-CM | POA: Insufficient documentation

## 2017-07-14 DIAGNOSIS — Z87891 Personal history of nicotine dependence: Secondary | ICD-10-CM | POA: Insufficient documentation

## 2017-07-14 DIAGNOSIS — S8991XA Unspecified injury of right lower leg, initial encounter: Secondary | ICD-10-CM | POA: Insufficient documentation

## 2017-07-14 DIAGNOSIS — J45909 Unspecified asthma, uncomplicated: Secondary | ICD-10-CM | POA: Insufficient documentation

## 2017-07-14 DIAGNOSIS — F419 Anxiety disorder, unspecified: Secondary | ICD-10-CM | POA: Insufficient documentation

## 2017-07-14 DIAGNOSIS — Z79899 Other long term (current) drug therapy: Secondary | ICD-10-CM | POA: Insufficient documentation

## 2017-07-14 MED ORDER — ACETAMINOPHEN 500 MG PO TABS
1000.0000 mg | ORAL_TABLET | Freq: Once | ORAL | Status: AC
  Administered 2017-07-14: 1000 mg via ORAL
  Filled 2017-07-14: qty 2

## 2017-07-14 MED ORDER — IBUPROFEN 600 MG PO TABS: 600.0000 mg | ORAL_TABLET | Freq: Four times a day (QID) | ORAL | 0 refills | Status: DC | PRN

## 2017-07-14 NOTE — ED Triage Notes (Signed)
Pt reports she "stepped wrong" last night and now has right knee pain. Difficulty extending knee and bearing weight since then. States she didn't actually hit the ground. States knee feels like "it's going to give out"

## 2017-07-14 NOTE — Discharge Instructions (Signed)
It was my pleasure taking care of you today!   Please see the information and instructions below regarding your visit.  Your diagnoses today include:  1. Injury of right knee, initial encounter     Tests performed today include: See side panel of your discharge paperwork for testing performed today. Vital signs are listed at the bottom of these instructions.   Medications prescribed:    Take any prescribed medications only as prescribed, and any over the counter medications only as directed on the packaging.  Ibuprofen/Tylenol as needed for pain. Use crutches as needed for comfort. Ice and elevate knee throughout the day.  You are prescribed ibuprofen, a non-steroidal anti-inflammatory agent (NSAID) for pain. You may take 600mg  every 6 hours as needed for pain. If still requiring this medication around the clock for acute pain after 10 days, please see your primary healthcare provider.  Women who are pregnant, breastfeeding, or planning on becoming pregnant should not take non-steroidal anti-inflammatories such as   You may combine this medication with Tylenol, 650 mg every 6 hours, so you are receiving something for pain every 3 hours.  This is not a long-term medication unless under the care and direction of your primary provider. Taking this medication long-term and not under the supervision of a healthcare provider could increase the risk of stomach ulcers, kidney problems, and cardiovascular problems such as high blood pressure.    Home care instructions:  Please follow any educational materials contained in this packet.   Please rest, ice, elevate, and compress the knee.  Please place a towel in between your skin in the ice when you are icing.  Follow-up instructions: Please follow-up with your primary care provider in one week for further evaluation of your symptoms if they are not completely improved.   Call the orthopedist listed today or tomorrow to schedule follow up  appointment for recheck of ongoing knee pain in one to two weeks. That appointment can be canceled with a 24-48 hour notice if complete resolution of pain.  Follow up with the orthopedist listed if symptoms do not improve in one week.   Return instructions:  Please return to the Emergency Department if you experience worsening symptoms.  Please return for any increasing swelling, increasing pain, loss of color to the lower leg, or increasing redness. Please return if you have any other emergent concerns.  Additional Information:   Your vital signs today were: BP 128/81 (BP Location: Left Arm)    Pulse 96    Temp 98.9 F (37.2 C) (Oral)    Resp 18    LMP 06/24/2017    SpO2 100%  If your blood pressure (BP) was elevated on multiple readings during this visit above 130 for the top number or above 80 for the bottom number, please have this repeated by your primary care provider within one month. --------------

## 2017-07-14 NOTE — ED Provider Notes (Signed)
[facility] EMERGENCY DEPARTMENT Provider Note   CSN: [id number] Arrival date & time: 07/14/17  1346     History   Chief Complaint Chief Complaint  Patient presents with  . Knee Pain    HPI Traci Rogers is a 32 y.o. female.  HPI  Patient is a 53-year female with a history of asthma and anxiety presenting for right knee injury.  Patient reports that she slipped on some ice that was on the ground and her friend caught her.  In the process, patient reports she felt a "pop as her right knee twisted.  Patient reports she attempted to ambulate after this incident, however had difficulty.  Patient denies any weakness, numbness, or tingling distal to the injury.  No head injury, loss consciousness, or other injury sustained in this event.  No remedies attempted prior to presentation of her symptoms.  Past Medical History:  Diagnosis Date  . Anxiety   . Arrhythmia   . Asthma   . Insomnia     There are no active problems to display for this patient.   Past Surgical History:  Procedure Laterality Date  . DILATION AND CURETTAGE OF UTERUS    . WRIST GANGLION EXCISION      OB History    No data available       Home Medications    Prior to Admission medications   Medication Sig Start Date End Date Taking? Authorizing Provider  aspirin-acetaminophen-caffeine (EXCEDRIN MIGRAINE) [phone] MG tablet Take 1 tablet by mouth every 6 (six) hours as needed for headache.    [provider]  benzonatate (TESSALON) 100 MG capsule Take 1 capsule (100 mg total) by mouth every 8 (eight) hours. 03/26/17   Domenic Moras, PA-C  guaiFENesin (ROBITUSSIN) 100 MG/5ML liquid Take 5-10 mLs (100-200 mg total) by mouth 4 (four) times daily as needed for congestion. 03/26/17   Domenic Moras, PA-C  ibuprofen (ADVIL,MOTRIN) 600 MG tablet Take 1 tablet (600 mg total) by mouth every 6 (six) hours as needed. 05/26/17   Eloise Levels, MD  naproxen (NAPROSYN) 500 MG tablet Take 1 tablet  (500 mg total) by mouth 2 (two) times daily. 03/22/16   Etta Quill, NP  ondansetron (ZOFRAN ODT) 4 MG disintegrating tablet Take 1 tablet (4 mg total) by mouth every 8 (eight) hours as needed for nausea or vomiting. 05/20/16   Gibson Ramp, MD  oxyCODONE-acetaminophen (PERCOCET/ROXICET) 5-325 MG tablet Take 1-2 tablets by mouth every 4 (four) hours as needed for severe pain. 10/07/15   Nat Christen, MD  promethazine (PHENERGAN) 25 MG tablet Take 1 tablet (25 mg total) by mouth every 6 (six) hours as needed for nausea or vomiting. 10/07/15   Nat Christen, MD    Family History Family History  Problem Relation Age of Onset  . Hypertension Mother     Social History Social History   Tobacco Use  . Smoking status: Former Smoker    Types: Cigars    Last attempt to quit: 08/09/2015    Years since quitting: 1.9  . Smokeless tobacco: Never Used  Substance Use Topics  . Alcohol use: Yes    Comment: occasional  . Drug use: No     Allergies   Calamine   Review of Systems Review of Systems  Musculoskeletal: Positive for arthralgias and joint swelling.  Skin: Negative for wound.  Neurological: Negative for syncope, weakness and numbness.     Physical Exam Updated Vital Signs BP 128/81 (BP Location: Left Arm)  Pulse 96   Temp 98.9 F (37.2 C) (Oral)   Resp 18   LMP 06/24/2017   SpO2 100%   Physical Exam  Constitutional: She appears well-developed and well-nourished. No distress.  Sitting comfortably in bed.  HENT:  Head: Normocephalic and atraumatic.  Eyes: Conjunctivae are normal. Right eye exhibits no discharge. Left eye exhibits no discharge.  EOMs normal to gross examination.  Neck: Normal range of motion.  Cardiovascular: Normal rate and regular rhythm.  Intact, 2+ DP and PT pulses bilaterally.  Pulmonary/Chest:  Normal respiratory effort. Patient converses comfortably. No audible wheeze or stridor.  Abdominal: She exhibits no distension.  Musculoskeletal: Normal  range of motion.  Right knee with tenderness to palpation of medial and lateral joint. Decreased ROM 2/2 pain. No joint line tenderness.  Mild joint effusion and swelling appreciated of medial knee. No abnormal alignment or patellar mobility. No bruising, erythema or warmth overlaying the joint. No varus/valgus laxity. Negative drawer's, Lachman's and McMurray's.  No crepitus.  2+ DP pulses bilaterally. All compartments are soft. Sensation intact distal to injury.  Neurological: She is alert.  Cranial nerves intact to gross observation. Patient moves extremities without difficulty.  Skin: Skin is warm and dry. She is not diaphoretic.  Psychiatric: She has a normal mood and affect. Her behavior is normal. Judgment and thought content normal.  Nursing note and vitals reviewed.    ED Treatments / Results  Labs (all labs ordered are listed, but only abnormal results are displayed) Labs Reviewed - No data to display  EKG  EKG Interpretation None       Radiology Dg Knee Complete 4 Views Right  Result Date: 07/14/2017 CLINICAL DATA:  Right knee pain following a twisting injury yesterday. EXAM: RIGHT KNEE - COMPLETE 4+ VIEW COMPARISON:  07/17/2015. FINDINGS: Interval moderate-sized effusion.  No fracture or dislocation seen. IMPRESSION: Moderate-sized effusion with no visible fracture. Electronically Signed   By: Claudie Revering M.D.   On: 07/14/2017 14:43    Procedures Procedures (including critical care time)  Medications Ordered in ED Medications  acetaminophen (TYLENOL) tablet 1,000 mg (1,000 mg Oral Given 07/14/17 1740)     Initial Impression / Assessment and Plan / ED Course  I have reviewed the triage vital signs and the nursing notes.  Pertinent labs & imaging results that were available during my care of the patient were reviewed by me and considered in my medical decision making (see chart for details).      Final Clinical Impressions(s) / ED Diagnoses   Final diagnoses:    Injury of right knee, initial encounter   Patient is well-appearing in no acute distress.  No wound obtained in this incident.  Patient with likely knee sprain.  No laxity of the need to suggest ACL, MCL, or LCL rupture.  Patient given knee sleeve and instructed to weight-bear as tolerated.  Patient to follow-up with Dr. Barbaraann Barthel of sports medicine.  Given RICE precautions.  Instructed not to take ibuprofen planning on becoming pregnant or breast-feeding.  Return precautions given for any increasing swelling, pain, pallor, paresthesias of the distal right lower extremity.  Patient is understanding and agrees with the plan of care.  ED Discharge Orders    None       Tamala Julian 07/14/17 1811    Isla Pence, MD 07/14/17 1925

## 2018-07-02 ENCOUNTER — Encounter (HOSPITAL_COMMUNITY): Payer: Self-pay | Admitting: *Deleted

## 2018-07-02 ENCOUNTER — Emergency Department (HOSPITAL_COMMUNITY)
Admission: EM | Admit: 2018-07-02 | Discharge: 2018-07-02 | Disposition: A | Discharge status: Home / Self Care | Payer: Self-pay | Attending: Emergency Medicine | Admitting: Emergency Medicine

## 2018-07-02 DIAGNOSIS — Z79899 Other long term (current) drug therapy: Secondary | ICD-10-CM | POA: Insufficient documentation

## 2018-07-02 DIAGNOSIS — Z87891 Personal history of nicotine dependence: Secondary | ICD-10-CM | POA: Insufficient documentation

## 2018-07-02 DIAGNOSIS — K529 Noninfective gastroenteritis and colitis, unspecified: Secondary | ICD-10-CM | POA: Insufficient documentation

## 2018-07-02 DIAGNOSIS — J45909 Unspecified asthma, uncomplicated: Secondary | ICD-10-CM | POA: Insufficient documentation

## 2018-07-02 LAB — COMPREHENSIVE METABOLIC PANEL
ALBUMIN: 3.9 g/dL (ref 3.5–5.0)
ALK PHOS: 80 U/L (ref 38–126)
ALT: 22 U/L (ref 0–44)
ANION GAP: 8 (ref 5–15)
AST: 19 U/L (ref 15–41)
BUN: 5 mg/dL — ABNORMAL LOW (ref 6–20)
CALCIUM: 9.4 mg/dL (ref 8.9–10.3)
CO2: 24 mmol/L (ref 22–32)
Chloride: 105 mmol/L (ref 98–111)
Creatinine, Ser: 0.67 mg/dL (ref 0.44–1.00)
GFR calc non Af Amer: >60 mL/min (ref >60)
GLUCOSE: 86 mg/dL (ref 70–99)
Potassium: 4.1 mmol/L (ref 3.5–5.1)
SODIUM: 137 mmol/L (ref 135–145)
Total Bilirubin: 0.8 mg/dL (ref 0.3–1.2)
Total Protein: 7.7 g/dL (ref 6.5–8.1)

## 2018-07-02 LAB — CBC
HCT: 40.7 % (ref 36.0–46.0)
HEMOGLOBIN: 12.3 g/dL (ref 12.0–15.0)
MCH: 23.5 pg — AB (ref 26.0–34.0)
MCHC: 30.2 g/dL (ref 30.0–36.0)
MCV: 77.7 fL — AB (ref 80.0–100.0)
NRBC: 0 % (ref 0.0–0.2)
PLATELETS: 243 10*3/uL (ref 150–400)
RBC: 5.24 MIL/uL — AB (ref 3.87–5.11)
RDW: 13.2 % (ref 11.5–15.5)
WBC: 10 10*3/uL (ref 4.0–10.5)

## 2018-07-02 LAB — I-STAT BETA HCG BLOOD, ED (MC, WL, AP ONLY): I-stat hCG, quantitative: <5 m[IU]/mL (ref <5)

## 2018-07-02 LAB — LIPASE, BLOOD: LIPASE: 20 U/L (ref 11–51)

## 2018-07-02 MED ORDER — ONDANSETRON 8 MG PO TBDP: ORAL_TABLET | ORAL | 0 refills | Status: DC

## 2018-07-02 MED ORDER — KETOROLAC TROMETHAMINE 30 MG/ML IJ SOLN
30.0000 mg | Freq: Once | INTRAMUSCULAR | Status: AC
  Administered 2018-07-02: 30 mg via INTRAVENOUS
  Filled 2018-07-02: qty 1

## 2018-07-02 MED ORDER — SODIUM CHLORIDE 0.9% FLUSH: 3.0000 mL | Freq: Once | INTRAVENOUS | Status: DC

## 2018-07-02 MED ORDER — ONDANSETRON HCL 4 MG/2ML IJ SOLN
4.0000 mg | Freq: Once | INTRAMUSCULAR | Status: AC
  Administered 2018-07-02: 4 mg via INTRAVENOUS
  Filled 2018-07-02: qty 2

## 2018-07-02 MED ORDER — SODIUM CHLORIDE 0.9 % IV BOLUS
1000.0000 mL | Freq: Once | INTRAVENOUS | Status: AC
  Administered 2018-07-02: 1000 mL via INTRAVENOUS

## 2018-07-02 NOTE — ED Provider Notes (Signed)
[facility] EMERGENCY DEPARTMENT Provider Note   CSN: [id number] Arrival date & time: 07/02/18  1335     History   Chief Complaint Chief Complaint  Patient presents with  . Emesis    HPI Traci Rogers is a 33 y.o. female.  Patient is a 33 year old female with history of prior D&C, asthma.  She presents today for evaluation of abdominal cramping, nausea, vomiting, and diarrhea that started this morning.  She believes she may have food poisoning, but does not recall eating any suspicious foods.  She denies any ill contacts.  All vomit and stool has been nonbloody.  She has felt fevered, but has not checked temperature.  The history is provided by the patient.  Emesis  Severity:  Moderate Timing:  Constant Progression:  Worsening Chronicity:  New Recent urination:  Normal Relieved by:  Nothing Worsened by:  Nothing Ineffective treatments:  None tried Associated symptoms: abdominal pain, diarrhea and fever     Past Medical History:  Diagnosis Date  . Anxiety   . Arrhythmia   . Asthma   . Insomnia     There are no active problems to display for this patient.   Past Surgical History:  Procedure Laterality Date  . DILATION AND CURETTAGE OF UTERUS    . WRIST GANGLION EXCISION       OB History   No obstetric history on file.      Home Medications    Prior to Admission medications   Medication Sig Start Date End Date Taking? Authorizing Provider  aspirin-acetaminophen-caffeine (EXCEDRIN MIGRAINE) [phone] MG tablet Take 1 tablet by mouth every 6 (six) hours as needed for headache.    [provider]  benzonatate (TESSALON) 100 MG capsule Take 1 capsule (100 mg total) by mouth every 8 (eight) hours. 03/26/17   Domenic Moras, PA-C  guaiFENesin (ROBITUSSIN) 100 MG/5ML liquid Take 5-10 mLs (100-200 mg total) by mouth 4 (four) times daily as needed for congestion. 03/26/17   Domenic Moras, PA-C  ibuprofen (ADVIL,MOTRIN) 600 MG tablet Take  1 tablet (600 mg total) by mouth every 6 (six) hours as needed. 07/14/17   Langston Masker B, PA-C  naproxen (NAPROSYN) 500 MG tablet Take 1 tablet (500 mg total) by mouth 2 (two) times daily. 03/22/16   Etta Quill, NP  ondansetron (ZOFRAN ODT) 4 MG disintegrating tablet Take 1 tablet (4 mg total) by mouth every 8 (eight) hours as needed for nausea or vomiting. 05/20/16   Gibson Ramp, MD  oxyCODONE-acetaminophen (PERCOCET/ROXICET) 5-325 MG tablet Take 1-2 tablets by mouth every 4 (four) hours as needed for severe pain. 10/07/15   Nat Christen, MD  promethazine (PHENERGAN) 25 MG tablet Take 1 tablet (25 mg total) by mouth every 6 (six) hours as needed for nausea or vomiting. 10/07/15   Nat Christen, MD    Family History Family History  Problem Relation Age of Onset  . Hypertension Mother     Social History Social History   Tobacco Use  . Smoking status: Former Smoker    Types: Cigars    Last attempt to quit: 08/09/2015    Years since quitting: 2.8  . Smokeless tobacco: Never Used  Substance Use Topics  . Alcohol use: Yes    Comment: occasional  . Drug use: No     Allergies   Calamine   Review of Systems Review of Systems  Constitutional: Positive for fever.  Gastrointestinal: Positive for abdominal pain, diarrhea and vomiting.  All other systems reviewed  and are negative.    Physical Exam Updated Vital Signs BP (!) 139/102 (BP Location: Right Arm)   Pulse 83   Temp 98 F (36.7 C) (Oral)   Resp 17   SpO2 100%   Physical Exam Vitals signs and nursing note reviewed.  Constitutional:      General: She is not in acute distress.    Appearance: She is well-developed. She is not diaphoretic.  HENT:     Head: Normocephalic and atraumatic.  Neck:     Musculoskeletal: Normal range of motion and neck supple.  Cardiovascular:     Rate and Rhythm: Normal rate and regular rhythm.     Heart sounds: No murmur. No friction rub. No gallop.   Pulmonary:     Effort: Pulmonary  effort is normal. No respiratory distress.     Breath sounds: Normal breath sounds. No wheezing.  Abdominal:     General: Bowel sounds are normal. There is no distension.     Palpations: Abdomen is soft.     Tenderness: There is abdominal tenderness. There is no guarding or rebound.     Comments: There is generalized abdominal tenderness.  Musculoskeletal: Normal range of motion.  Skin:    General: Skin is warm and dry.  Neurological:     Mental Status: She is alert and oriented to person, place, and time.      ED Treatments / Results  Labs (all labs ordered are listed, but only abnormal results are displayed) Labs Reviewed  LIPASE, BLOOD  COMPREHENSIVE METABOLIC PANEL  CBC  URINALYSIS, ROUTINE W REFLEX MICROSCOPIC  I-STAT BETA HCG BLOOD, ED ([facility], [facility], [facility] ONLY)    EKG None  Radiology No results found.  Procedures Procedures (including critical care time)  Medications Ordered in ED Medications  sodium chloride flush (NS) 0.9 % injection 3 mL (has no administration in time range)  ondansetron (ZOFRAN) injection 4 mg (has no administration in time range)  sodium chloride 0.9 % bolus 1,000 mL (has no administration in time range)  ketorolac (TORADOL) 30 MG/ML injection 30 mg (has no administration in time range)     Initial Impression / Assessment and Plan / ED Course  I have reviewed the triage vital signs and the nursing notes.  Pertinent labs & imaging results that were available during my care of the patient were reviewed by me and considered in my medical decision making (see chart for details).  Patient is a 33 year old female presenting with nausea, vomiting, and diarrhea.  Her presentation, exam, work-up, and response to medications is most consistent with a viral gastroenteritis.  The patient was given IV fluids, Toradol, and Zofran, and will be discharged to home.  Final Clinical Impressions(s) / ED Diagnoses   Final diagnoses:  None    ED Discharge  Orders    None       Veryl Speak, MD 07/02/18 [phone]

## 2018-07-02 NOTE — Discharge Instructions (Signed)
Zofran as prescribed as needed for nausea.  Drink plenty of fluids and get plenty of rest.  Return to the emergency department for severe abdominal pain, bloody stool or vomit, or other new and concerning symptoms.

## 2018-07-02 NOTE — ED Triage Notes (Signed)
Pt in c/o possible food poisoning, developed cramping while eating something two days ago then developed n/v/d, no distress noted

## 2018-07-21 IMAGING — CT CT ABD-PELV W/O CM
2 of 4 series · 17 of 46 positions shown, 19 images · non-contrast
Comparison: 03/10/2015

CLINICAL DATA: LEFT side abdominal pain, nausea, vomiting, and
diarrhea since last [REDACTED], hematuria, blood clots in urine,
history asthma, former smoker

EXAM:
CT ABDOMEN AND PELVIS WITHOUT CONTRAST
TECHNIQUE: Multidetector CT imaging of the abdomen and pelvis was performed
following the standard protocol without IV contrast. Sagittal and
coronal MPR images reconstructed from axial data set. Oral contrast
was not administered.

[Series 2: renal stone 5.0 · axial · 0.80mm/px · z∈[-480,-40]mm · 14 of 96 slices shown, 16 images]
[im 4/96  soft-tissue]
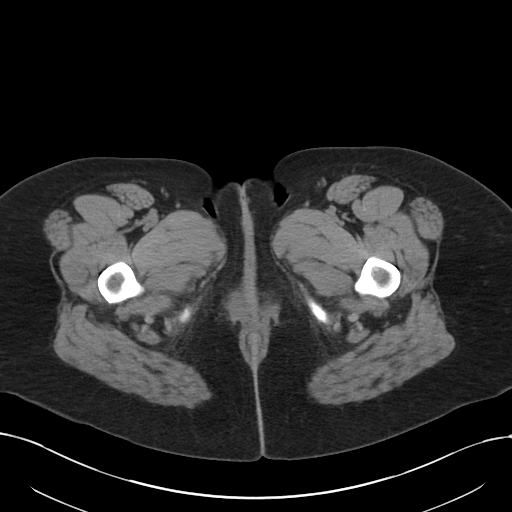
[im 4/96  bone]
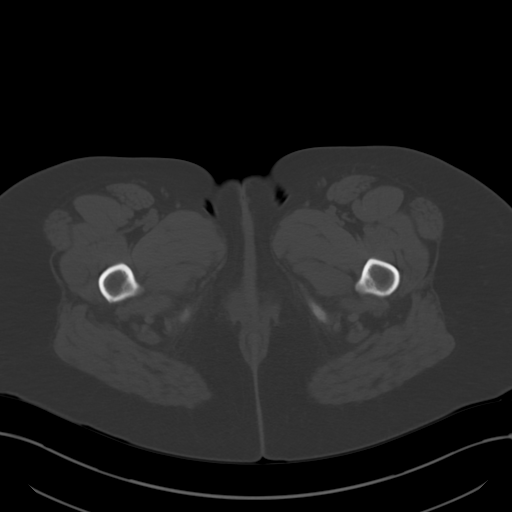
[im 12/96  soft-tissue]
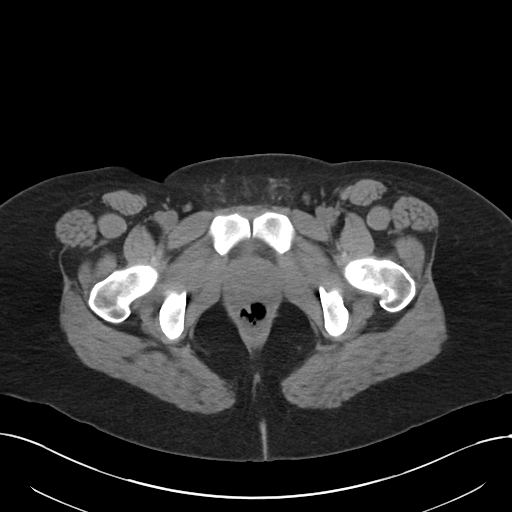
[im 20/96  soft-tissue]
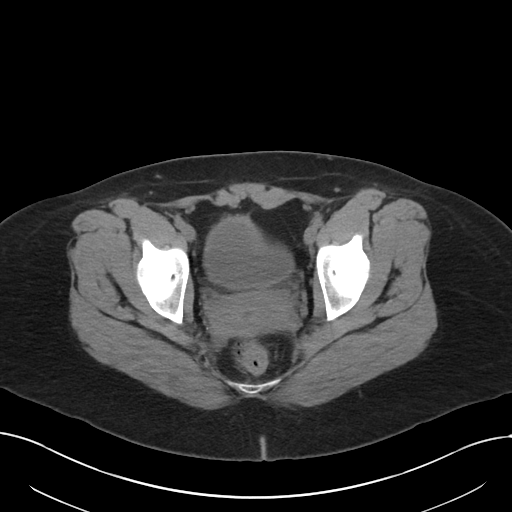
[im 27/96  soft-tissue]
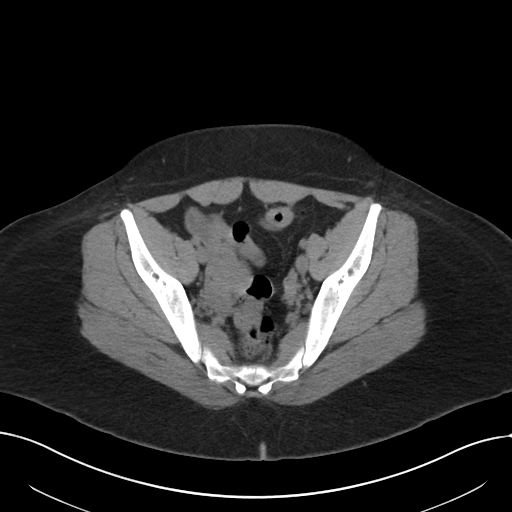
[im 31/96  soft-tissue]
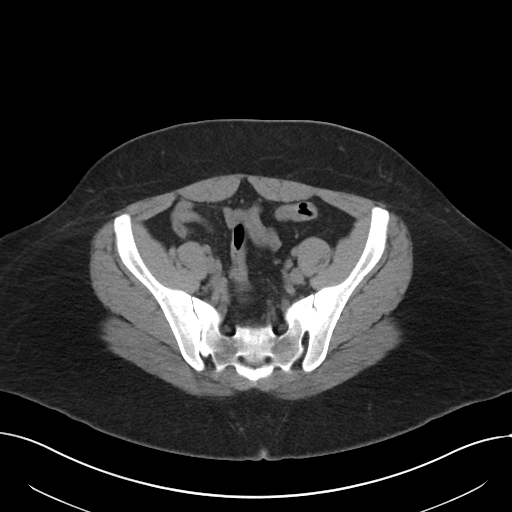
[im 39/96  soft-tissue]
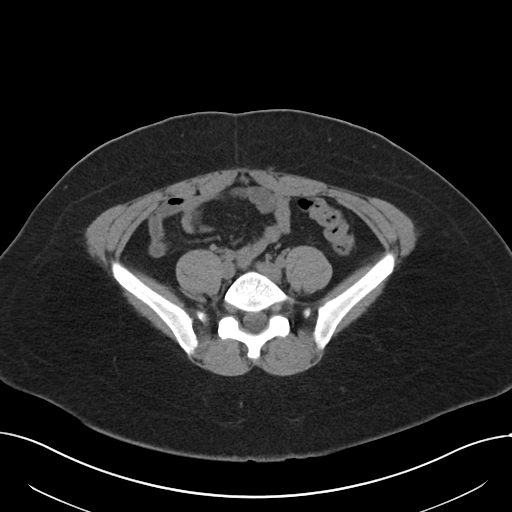
[im 46/96  soft-tissue]
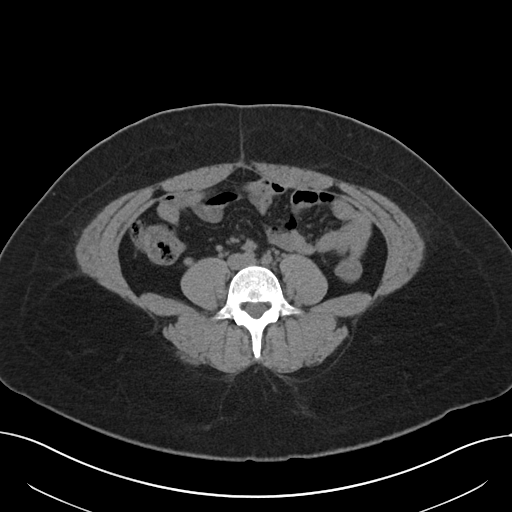
[im 50/96  soft-tissue]
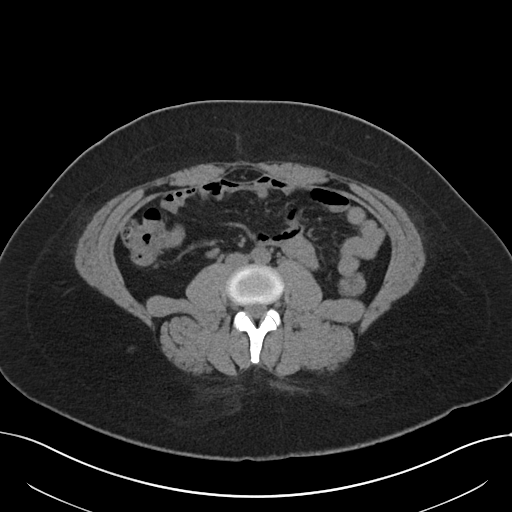
[im 58/96  soft-tissue]
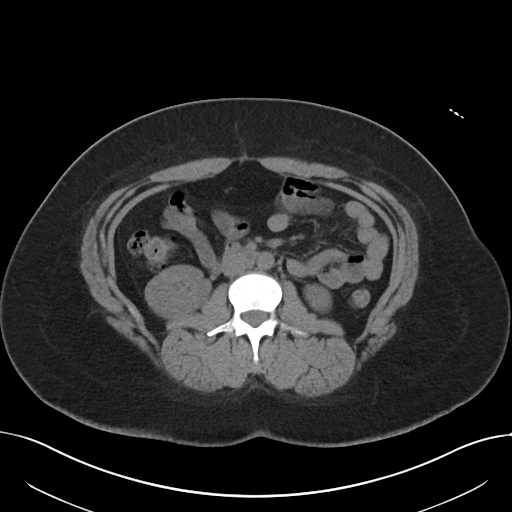
[im 58/96  bone]
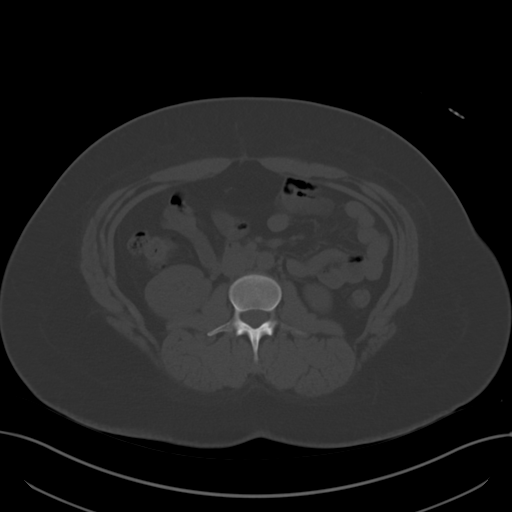
[im 65/96  soft-tissue]
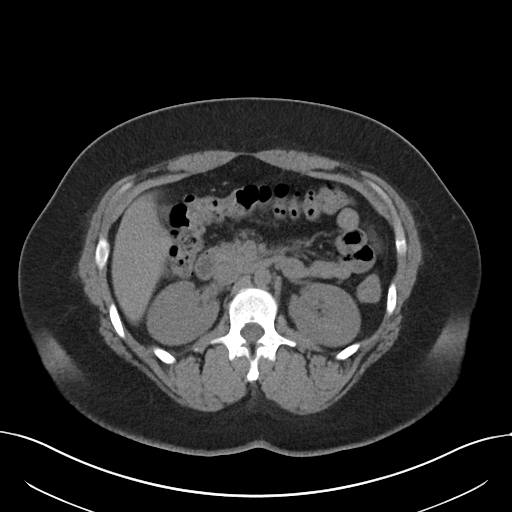
[im 73/96  soft-tissue]
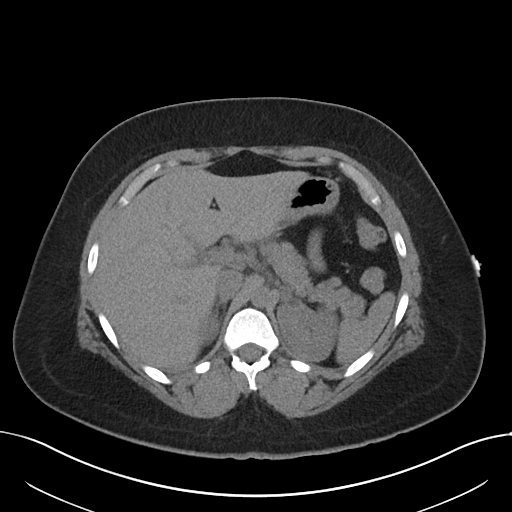
[im 77/96  soft-tissue]
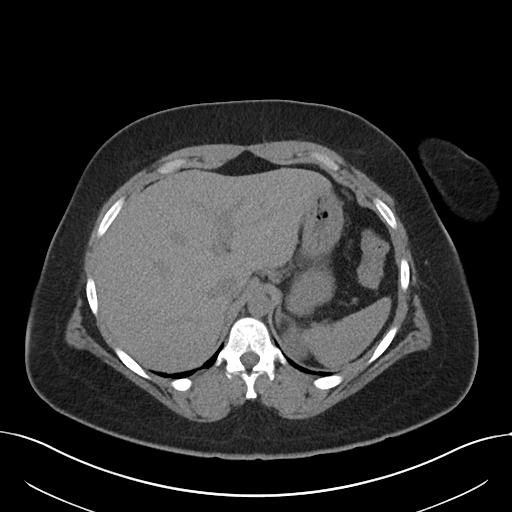
[im 84/96  soft-tissue]
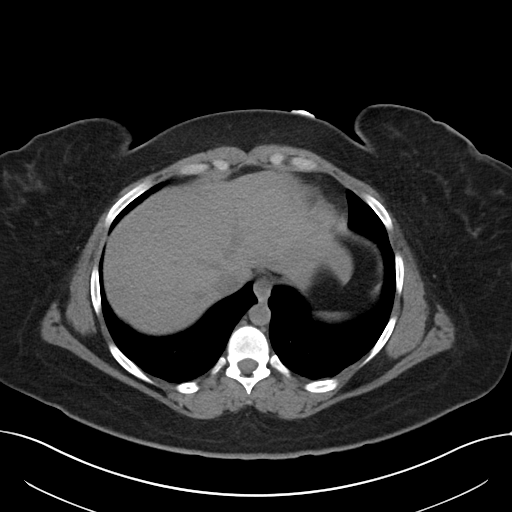
[im 92/96  soft-tissue]
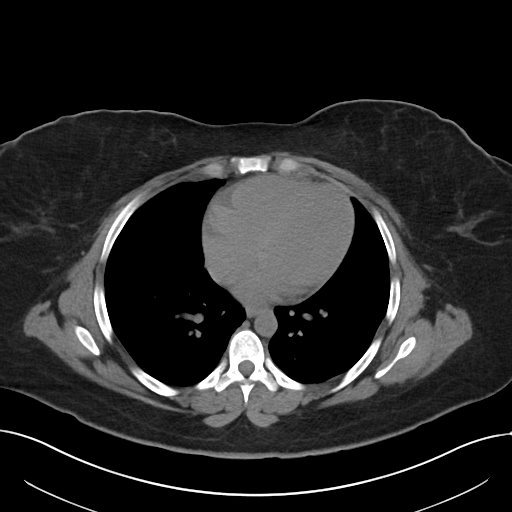

[Series 4: renal stone 3.0 cor · coronal · 0.92mm/px · 3 of 97 slices shown]
[im 33/97  soft-tissue]
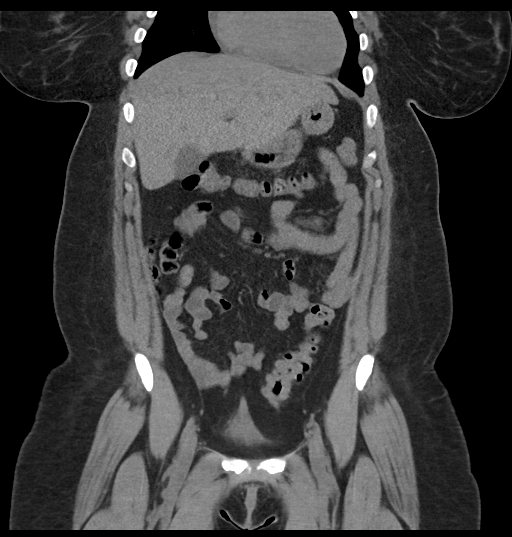
[im 43/97  soft-tissue]
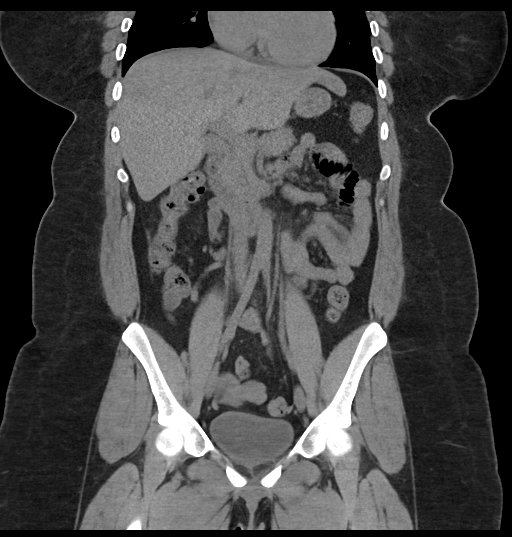
[im 54/97  soft-tissue]
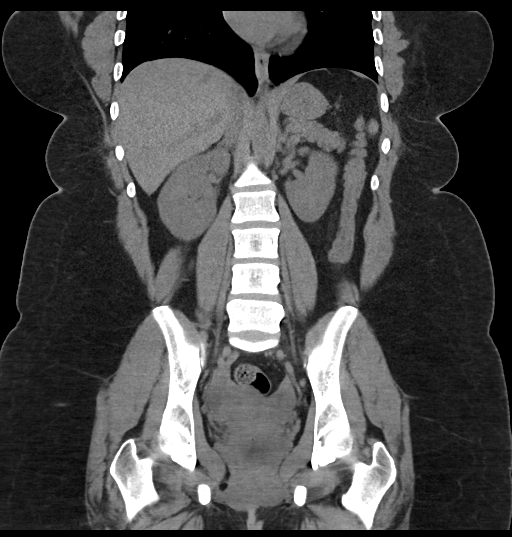

[17 of 46 positions shown; findings below may reference images not displayed]

FINDINGS: Lower chest: Tiny nodular focus of pleural thickening LEFT major
fissure image 3 unchanged. Subpleural nodule RIGHT lung base image 1
unchanged. Minimal subsegmental atelectasis LEFT lower lobe.

Hepatobiliary: Gallbladder and liver normal appearance

Pancreas: Normal appearance

Spleen: Normal appearance.  Question tiny adjacent splenule stable.

Adrenals/Urinary Tract: Adrenal glands normal appearance. Kidneys,
ureters, and bladder normal appearance. No urinary tract
calcification, hydronephrosis or ureteral dilatation.

Stomach/Bowel: Normal appendix. Stomach and bowel loops unremarkable
for technique.

Vascular/Lymphatic: Unremarkable

Reproductive: Unremarkable

Other: No free air or free fluid. No hernia or acute inflammatory
process.

Musculoskeletal: Normal appearance
IMPRESSION: No acute intra- abdominal or intrapelvic abnormalities.

Specifically, no definite urinary tract abnormalities identified.

Stable tiny nodular foci at the lung bases.

## 2018-12-04 ENCOUNTER — Ambulatory Visit: Payer: Self-pay | Admitting: Registered Nurse

## 2018-12-04 ENCOUNTER — Encounter: Payer: Self-pay | Admitting: Registered Nurse

## 2018-12-04 ENCOUNTER — Ambulatory Visit (INDEPENDENT_AMBULATORY_CARE_PROVIDER_SITE_OTHER): Payer: Self-pay | Admitting: Registered Nurse

## 2018-12-04 ENCOUNTER — Other Ambulatory Visit: Payer: Self-pay

## 2018-12-04 VITALS — BP 136/90 | HR 82 | Temp 98.6°F | Resp 17 | Ht 62.0 in | Wt 228.8 lb

## 2018-12-04 DIAGNOSIS — R569 Unspecified convulsions: Secondary | ICD-10-CM | POA: Insufficient documentation

## 2018-12-04 DIAGNOSIS — F329 Major depressive disorder, single episode, unspecified: Secondary | ICD-10-CM

## 2018-12-04 DIAGNOSIS — F32A Depression, unspecified: Secondary | ICD-10-CM | POA: Insufficient documentation

## 2018-12-04 MED ORDER — ESCITALOPRAM OXALATE 5 MG/5ML PO SOLN: 10.0000 mg | Freq: Every day | ORAL | 12 refills | Status: DC

## 2018-12-04 NOTE — Patient Instructions (Signed)
° ° ° °  If you have lab work done today you will be contacted with your lab results within the next 2 weeks.  If you have not heard from us then please contact us. The fastest way to get your results is to register for My Chart. ° ° °IF you received an x-ray today, you will receive an invoice from [facility]. Please contact [facility] at [phone] with questions or concerns regarding your invoice.  ° °IF you received labwork today, you will receive an invoice from [facility]. Please contact [facility] at [phone] with questions or concerns regarding your invoice.  ° °Our billing staff will not be able to assist you with questions regarding bills from these companies. ° °You will be contacted with the lab results as soon as they are available. The fastest way to get your results is to activate your My Chart account. Instructions are located on the last page of this paperwork. If you have not heard from us regarding the results in 2 weeks, please contact this office. °  ° ° ° °

## 2018-12-04 NOTE — Progress Notes (Signed)
Established Patient Office Visit  Subjective:  Patient ID: Traci Rogers, female    DOB: [birth year]  Age: 33 y.o. MRN: [id number]  CC:  Chief Complaint  Patient presents with  . New Patient (Initial Visit)    biting tongue in sleep x couple months, per pt thought it was due to tiredness.  Family hx of it and mom recently passed from [facility] and she had breast cancer. Migraines sharp pains at back of head and temple.  She has nausea and vomitting and sensitiviity to light and sound.  Ibuprofen for pain but doesn't help.  Pain level 10/10.  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 Insect Bite    x 1 week on the left upper back, red, raised, itchy and painful  . Depression    HPI Traci Rogers presents for visit to establish care. She has been seen at [facility] but has not had primary care visits. Concerns as listed above.  We covered a number of issues at this visit:  Seizures: Pt reports waking up biting her tongue, muscles sore, tensed, aching. Has been told by family members that she appears to be seizing while she sleeps. Has absence episodes during the day - mid conversation, minutes at a time, cannot remember what has happened or what has been said. Warrants neuro consult.   Depression: Pt interested in starting on antidepressant medication. Denies SI, endorses "call of the void" feelings - has good support system, knows self-harm is not the answer.   Hand pain: types around 8 hours each day. Feels like wrist locks up with pain up arm. Often gives her trouble moving fingers.   Insect bite: 3-68 days old, red, painful, itchy, sometimes raised. No known infestations. No tick bite that she knows of. No systemic symptoms. Has not tried treatment.   Past Medical History:  Diagnosis Date  . Anxiety   . Arrhythmia   . Asthma   . Insomnia     Past  Surgical History:  Procedure Laterality Date  . DILATION AND CURETTAGE OF UTERUS    . WRIST GANGLION EXCISION      Family History  Problem Relation Age of Onset  . Hypertension Mother     Social History   Socioeconomic History  . Marital status: Single    Spouse name: Not on file  . Number of children: Not on file  . Years of education: Not on file  . Highest education level: Not on file  Occupational History  . Not on file  Social Needs  . Financial resource strain: Not on file  . Food insecurity    Worry: Not on file    Inability: Not on file  . Transportation needs    Medical: Not on file    Non-medical: Not on file  Tobacco Use  . Smoking status: Former Smoker    Types: Cigars    Quit date: 08/09/2015    Years since quitting: 3.3  . Smokeless tobacco: Never Used  Substance and Sexual Activity  . Alcohol use: Yes    Comment: occasional  . Drug use: No  . Sexual activity: Yes    Birth control/protection: None  Lifestyle  . Physical activity    Days per week: Not on file    Minutes per session: Not on file  . Stress:  Not on file  Relationships  . Social Herbalist on phone: Not on file    Gets together: Not on file    Attends religious service: Not on file    Active member of club or organization: Not on file    Attends meetings of clubs or organizations: Not on file    Relationship status: Not on file  . Intimate partner violence    Fear of current or ex partner: Not on file    Emotionally abused: Not on file    Physically abused: Not on file    Forced sexual activity: Not on file  Other Topics Concern  . Not on file  Social History Narrative  . Not on file    Outpatient Medications Prior to Visit  Medication Sig Dispense Refill  . ibuprofen (ADVIL,MOTRIN) 600 MG tablet Take 1 tablet (600 mg total) by mouth every 6 (six) hours as needed. 30 tablet 0  . Multiple Vitamin (MULTIVITAMIN) tablet Take 1 tablet by mouth daily.    Marland Kitchen  aspirin-acetaminophen-caffeine (EXCEDRIN MIGRAINE) 250-250-65 MG tablet Take 1 tablet by mouth every 6 (six) hours as needed for headache.    . benzonatate (TESSALON) 100 MG capsule Take 1 capsule (100 mg total) by mouth every 8 (eight) hours. (Patient not taking: Reported on 12/04/2018) 21 capsule 0  . guaiFENesin (ROBITUSSIN) 100 MG/5ML liquid Take 5-10 mLs (100-200 mg total) by mouth 4 (four) times daily as needed for congestion. (Patient not taking: Reported on 12/04/2018) 100 mL 0  . naproxen (NAPROSYN) 500 MG tablet Take 1 tablet (500 mg total) by mouth 2 (two) times daily. (Patient not taking: Reported on 12/04/2018) 20 tablet 0  . ondansetron (ZOFRAN ODT) 8 MG disintegrating tablet 8mg  ODT q4 hours prn nausea (Patient not taking: Reported on 12/04/2018) 6 tablet 0  . oxyCODONE-acetaminophen (PERCOCET/ROXICET) 5-325 MG tablet Take 1-2 tablets by mouth every 4 (four) hours as needed for severe pain. (Patient not taking: Reported on 12/04/2018) 6 tablet 0  . promethazine (PHENERGAN) 25 MG tablet Take 1 tablet (25 mg total) by mouth every 6 (six) hours as needed for nausea or vomiting. (Patient not taking: Reported on 12/04/2018) 6 tablet 0   No facility-administered medications prior to visit.     Allergies  Allergen Reactions  . Calamine Hives and Swelling    "Have you ever seen Hitch?"    ROS Review of Systems  Constitutional: Negative.   HENT: Negative.   Eyes: Negative.   Respiratory: Negative.   Cardiovascular: Negative.   Gastrointestinal: Negative.   Endocrine: Negative.   Genitourinary: Negative.   Musculoskeletal: Positive for arthralgias.  Skin: Positive for rash.  Allergic/Immunologic: Negative.   Neurological: Negative.   Hematological: Negative.   Psychiatric/Behavioral: Positive for agitation, dysphoric mood and sleep disturbance. Negative for suicidal ideas. The patient is nervous/anxious.       Objective:    Physical Exam  Constitutional: She is oriented to  person, place, and time. She appears well-developed and well-nourished.  Cardiovascular: Normal rate.  Pulmonary/Chest: Effort normal. No respiratory distress.  Neurological: She is alert and oriented to person, place, and time.  Skin: Skin is warm and dry. Rash noted. There is erythema.  Psychiatric: She has a normal mood and affect. Her behavior is normal. Judgment and thought content normal.  Nursing note and vitals reviewed.   BP 136/90 (BP Location: Left Arm, Patient Position: Sitting, Cuff Size: Normal)   Pulse 82   Temp 98.6 F (37 C) (Oral)  Resp 17   Ht 5\' 2"  (1.575 m)   Wt 228 lb 12.8 oz (103.8 kg)   LMP 11/30/2018   SpO2 100%   BMI 41.85 kg/m  Wt Readings from Last 3 Encounters:  12/04/18 228 lb 12.8 oz (103.8 kg)  03/26/17 215 lb (97.5 kg)  01/08/17 213 lb (96.6 kg)     Health Maintenance Due  Topic Date Due  . HIV Screening  01/06/2003  . TETANUS/TDAP  01/06/2007  . PAP SMEAR-Modifier  01/05/2009    There are no preventive care reminders to display for this patient.  No results found for: TSH Lab Results  Component Value Date   WBC 10.0 07/02/2018   HGB 12.3 07/02/2018   HCT 40.7 07/02/2018   MCV 77.7 (L) 07/02/2018   PLT 243 07/02/2018   Lab Results  Component Value Date   NA 137 07/02/2018   K 4.1 07/02/2018   CO2 24 07/02/2018   GLUCOSE 86 07/02/2018   BUN 5 (L) 07/02/2018   CREATININE 0.67 07/02/2018   BILITOT 0.8 07/02/2018   ALKPHOS 80 07/02/2018   AST 19 07/02/2018   ALT 22 07/02/2018   PROT 7.7 07/02/2018   ALBUMIN 3.9 07/02/2018   CALCIUM 9.4 07/02/2018   ANIONGAP 8 07/02/2018   No results found for: CHOL No results found for: HDL No results found for: LDLCALC No results found for: TRIG No results found for: CHOLHDL No results found for: HGBA1C    Assessment & Plan:   Problem List Items Addressed This Visit    None    Visit Diagnoses    Seizures ([place])    -  Primary   Relevant Orders   Ambulatory referral to Neurology    Depression, unspecified depression type       Relevant Medications   escitalopram (LEXAPRO) 5 MG/5ML solution      Meds ordered this encounter  Medications  . escitalopram (LEXAPRO) 5 MG/5ML solution    Sig: Take 10 mLs (10 mg total) by mouth daily.    Dispense:  240 mL    Refill:  12    Order Specific Question:   Supervising Provider    Answer:   Delia Chimes A [id number]    Follow-up: No follow-ups on file.   PLAN:  Rash appears to be insect bites - appears to be healing. Some excoriation from scratching. Recommended OTC hydrocortisone cream to help with itching.  Depression: agreed to start Escitalopram 10mg  PO qd. Will follow up with med check in 3 mos.  Wrist pain: sounds like likely carpal tunnel - will try nonpharm to address for now, will consider referral if it worsens.  Seizures: referred to neurology.  Patient encouraged to call clinic with any questions, comments, or concerns.    Maximiano Coss, NP

## 2019-03-03 ENCOUNTER — Ambulatory Visit: Payer: Self-pay | Admitting: Registered Nurse

## 2019-03-04 ENCOUNTER — Encounter: Payer: Self-pay | Admitting: Registered Nurse

## 2019-06-24 ENCOUNTER — Emergency Department (HOSPITAL_BASED_OUTPATIENT_CLINIC_OR_DEPARTMENT_OTHER)
Admission: EM | Admit: 2019-06-24 | Discharge: 2019-06-24 | Disposition: A | Discharge status: Home / Self Care | Payer: Self-pay | Attending: Emergency Medicine | Admitting: Emergency Medicine

## 2019-06-24 ENCOUNTER — Encounter (HOSPITAL_BASED_OUTPATIENT_CLINIC_OR_DEPARTMENT_OTHER): Payer: Self-pay

## 2019-06-24 ENCOUNTER — Other Ambulatory Visit: Payer: Self-pay

## 2019-06-24 DIAGNOSIS — R29898 Other symptoms and signs involving the musculoskeletal system: Secondary | ICD-10-CM

## 2019-06-24 DIAGNOSIS — D179 Benign lipomatous neoplasm, unspecified: Secondary | ICD-10-CM | POA: Insufficient documentation

## 2019-06-24 DIAGNOSIS — M954 Acquired deformity of chest and rib: Secondary | ICD-10-CM | POA: Insufficient documentation

## 2019-06-24 DIAGNOSIS — Z79899 Other long term (current) drug therapy: Secondary | ICD-10-CM | POA: Insufficient documentation

## 2019-06-24 DIAGNOSIS — J45909 Unspecified asthma, uncomplicated: Secondary | ICD-10-CM | POA: Insufficient documentation

## 2019-06-24 DIAGNOSIS — Z888 Allergy status to other drugs, medicaments and biological substances status: Secondary | ICD-10-CM | POA: Insufficient documentation

## 2019-06-24 DIAGNOSIS — Z87891 Personal history of nicotine dependence: Secondary | ICD-10-CM | POA: Insufficient documentation

## 2019-06-24 HISTORY — DX: Depression, unspecified: F32.A

## 2019-06-24 HISTORY — DX: Post-traumatic stress disorder, unspecified: F43.10

## 2019-06-24 MED ORDER — ACETAMINOPHEN 500 MG PO TABS: 1000.0000 mg | ORAL_TABLET | Freq: Once | ORAL | Status: DC

## 2019-06-24 MED ORDER — IBUPROFEN 800 MG PO TABS
800.0000 mg | ORAL_TABLET | Freq: Once | ORAL | Status: AC
Start: 2019-06-24 — End: 2019-06-24
  Administered 2019-06-24: 02:00:00 800 mg via ORAL
  Filled 2019-06-24: qty 1

## 2019-06-24 MED ORDER — ACETAMINOPHEN 500 MG PO TABS
1000.0000 mg | ORAL_TABLET | Freq: Once | ORAL | Status: AC
  Administered 2019-06-24: 1000 mg via ORAL
  Filled 2019-06-24: qty 2

## 2019-06-24 NOTE — ED Triage Notes (Signed)
Pt here with multiple complaints.   1)Pt reports a "bump" in the center of her chest that is painful when she pushes on it.   2) Pt c/o lumbar pain with radiation into L leg. Pt lifts heavy boxes at work.

## 2019-06-24 NOTE — ED Provider Notes (Signed)
[facility] EMERGENCY DEPARTMENT Provider Note   CSN: [id number] Arrival date & time: 06/24/19  0116     History Chief Complaint  Patient presents with  . Mass    Traci Rogers is a 34 y.o. female.  The history is provided by the patient.  Illness Location:  Between the breasts and the left upper buttock cheek Quality:  Painful and patient states there is a "lump" in both locations Severity:  Moderate Onset quality:  Gradual Duration:  1 day Timing:  Constant Progression:  Worsening Chronicity:  New Context:  Worse with movement palpation Relieved by:  Nothing Worsened by:  Touching both areas Ineffective treatments:  None tried Associated symptoms: no abdominal pain, no chest pain, no congestion, no cough, no diarrhea, no ear pain, no fatigue, no fever, no headaches, no loss of consciousness, no myalgias, no nausea, no rash, no rhinorrhea, no shortness of breath, no sore throat, no vomiting and no wheezing   Risk factors:  None Patient has a "lump" between breasts where abdomen meets the chest and also a lump at the top of her left buttock cheek. Both areas are painful with movement and palpation.  No CP no SOB.  No f/c/r.  No weight loss.       Past Medical History:  Diagnosis Date  . Anxiety   . Arrhythmia   . Asthma   . Depression   . Insomnia   . PTSD (post-traumatic stress disorder)     Patient Active Problem List   Diagnosis Date Noted  . Depression 12/04/2018  . Seizures ([place]) 12/04/2018    Past Surgical History:  Procedure Laterality Date  . DILATION AND CURETTAGE OF UTERUS    . WRIST GANGLION EXCISION       OB History   No obstetric history on file.     Family History  Problem Relation Age of Onset  . Hypertension Mother     Social History   Tobacco Use  . Smoking status: Former Smoker    Types: Cigars    Quit date: 08/09/2015    Years since quitting: 3.8  . Smokeless tobacco: Never Used  Substance Use Topics  .  Alcohol use: Yes    Comment: occasional  . Drug use: No    Home Medications Prior to Admission medications   Medication Sig Start Date End Date Taking? Authorizing Provider  escitalopram (LEXAPRO) 5 MG/5ML solution Take 10 mLs (10 mg total) by mouth daily. 12/04/18   Maximiano Coss, NP  ibuprofen (ADVIL,MOTRIN) 600 MG tablet Take 1 tablet (600 mg total) by mouth every 6 (six) hours as needed. 07/14/17   Langston Masker B, PA-C  Multiple Vitamin (MULTIVITAMIN) tablet Take 1 tablet by mouth daily.    [provider]    Allergies    Calamine  Review of Systems   Review of Systems  Constitutional: Negative for fatigue and fever.  HENT: Negative for congestion, ear pain, rhinorrhea and sore throat.   Eyes: Negative for visual disturbance.  Respiratory: Negative for cough, shortness of breath and wheezing.   Cardiovascular: Negative for chest pain, palpitations and leg swelling.  Gastrointestinal: Negative for abdominal pain, diarrhea, nausea and vomiting.  Genitourinary: Negative for difficulty urinating.  Musculoskeletal: Negative for myalgias.  Skin: Negative for rash.  Neurological: Negative for loss of consciousness, weakness and headaches.  Psychiatric/Behavioral: Negative for agitation.  All other systems reviewed and are negative.   Physical Exam Updated Vital Signs BP 108/88 (BP Location: Left Arm)  Pulse 82   Temp 98.7 F (37.1 C) (Oral)   Resp 20   Ht 5\' 2"  (1.575 m)   Wt 107.5 kg   LMP 05/28/2019   SpO2 100%   BMI 43.35 kg/m   Physical Exam Vitals and nursing note reviewed.  Constitutional:      General: She is not in acute distress.    Appearance: Normal appearance.  HENT:     Head: Normocephalic and atraumatic.     Nose: Nose normal.  Eyes:     Conjunctiva/sclera: Conjunctivae normal.     Pupils: Pupils are equal, round, and reactive to light.  Cardiovascular:     Rate and Rhythm: Normal rate and regular rhythm.     Pulses: Normal pulses.      Heart sounds: Normal heart sounds.  Pulmonary:     Effort: Pulmonary effort is normal.     Breath sounds: Normal breath sounds.  Abdominal:     General: Abdomen is flat. Bowel sounds are normal.     Tenderness: There is no abdominal tenderness. There is no guarding.  Musculoskeletal:       Arms:     Cervical back: Normal range of motion and neck supple.  Skin:    General: Skin is warm and dry.     Capillary Refill: Capillary refill takes less than 2 seconds.  Neurological:     General: No focal deficit present.     Mental Status: She is alert and oriented to person, place, and time.  Psychiatric:        Mood and Affect: Mood is anxious.     ED Results / Procedures / Treatments   Labs (all labs ordered are listed, but only abnormal results are displayed) Labs Reviewed - No data to display  EKG None  Radiology No results found.  Procedures Procedures (including critical care time)  Medications Ordered in ED Medications  ibuprofen (ADVIL) tablet 800 mg (800 mg Oral Given 06/24/19 0216)  acetaminophen (TYLENOL) tablet 1,000 mg (1,000 mg Oral Given 06/24/19 0216)    ED Course  I have reviewed the triage vital signs and the nursing notes.  Pertinent labs & imaging results that were available during my care of the patient were reviewed by me and considered in my medical decision making (see chart for details).  I explained that the "lump" is the patient's xiphoid process and is a normal physiologic finding and should be there.  I cannot say how it may have been irritated that it is causing her pain, but the underwires of her bra may be rubbing against it and irritating it.  I have advised tylenol, ibuprofen and wearing a sports bra to avoid rubbing against the xiphoid.  The lipoma is rubbery and soft.  Tylenol and ibupofen and not wearing her waist band over this area will improve her symptoms.  There is no Lymphadenopathy on exam.  Patient is anxious and I have spoken to her at  length that she is well.    Traci Rogers was evaluated in Emergency Department on 06/24/2019 for the symptoms described in the history of present illness. She was evaluated in the context of the global COVID-19 pandemic, which necessitated consideration that the patient might be at risk for infection with the SARS-CoV-2 virus that causes COVID-19. Institutional protocols and algorithms that pertain to the evaluation of patients at risk for COVID-19 are in a state of rapid change based on information released by regulatory bodies including the CDC and federal and  state organizations. These policies and algorithms were followed during the patient's care in the ED.  Final Clinical Impression(s) / ED Diagnoses Final diagnoses:  Lipoma, unspecified site  Xiphoid prominence   Return for intractable cough, coughing up blood,fevers >100.4 unrelieved by medication, shortness of breath, intractable vomiting, chest pain, shortness of breath, weakness,numbness, changes in speech, facial asymmetry,abdominal pain, passing out,Inability to tolerate liquids or food, cough, altered mental status or any concerns. No signs of systemic illness or infection. The patient is nontoxic-appearing on exam and vital signs are within normal limits.   I have reviewed the triage vital signs and the nursing notes. Pertinent labs &imaging results that were available during my care of the patient were reviewed by me and considered in my medical decision making (see chart for details).  After history, exam, and medical workup I feel the patient has been appropriately medically screened and is safe for discharge home. Pertinent diagnoses were discussed with the patient. Patient was given return      Sharnetta Gielow, MD 06/24/19 0510

## 2019-08-22 ENCOUNTER — Encounter (HOSPITAL_COMMUNITY): Payer: Self-pay | Admitting: Emergency Medicine

## 2019-08-22 ENCOUNTER — Ambulatory Visit (HOSPITAL_COMMUNITY)
Admission: EM | Admit: 2019-08-22 | Discharge: 2019-08-22 | Disposition: A | Discharge status: Home / Self Care | Payer: HRSA Program | Attending: Internal Medicine | Admitting: Internal Medicine

## 2019-08-22 ENCOUNTER — Other Ambulatory Visit: Payer: Self-pay

## 2019-08-22 DIAGNOSIS — Z20822 Contact with and (suspected) exposure to covid-19: Secondary | ICD-10-CM | POA: Diagnosis not present

## 2019-08-22 NOTE — Discharge Instructions (Signed)
If your Covid-19 test is positive, you will receive a phone call from [facility] regarding your results. Negative test results are not called. Both positive and negative results area always visible on MyChart. If you do not have a MyChart account, sign up instructions are in your discharge papers.   Persons who are directed to care for themselves at home may discontinue isolation under the following conditions:   At least 10 days have passed since symptom onset and  At least 24 hours have passed without running a fever (this means without the use of fever-reducing medications) and  Other symptoms have improved.  Persons infected with COVID-19 who never develop symptoms may discontinue isolation and other precautions 10 days after the date of their first positive COVID-19 test.  

## 2019-08-22 NOTE — ED Triage Notes (Signed)
Patient requesting a covid test for work.

## 2019-08-22 NOTE — ED Provider Notes (Signed)
[facility]    CSN: [id number] Arrival date & time: 08/22/19  1349      History   Chief Complaint Chief Complaint  Patient presents with  . Labs Only    HPI Traci Rogers is a 34 y.o. female.   Patient reports for Covid testing at the request of her job.  There was a employee who tested positive that she was in contact with.  She is asymptomatic and denies any and all symptoms.  She denies symptoms of cough, congestion, shortness of breath, headache, sore throat, nausea, vomiting, diarrhea, change or loss of sense of smell.     Past Medical History:  Diagnosis Date  . Anxiety   . Arrhythmia   . Asthma   . Depression   . Insomnia   . PTSD (post-traumatic stress disorder)     Patient Active Problem List   Diagnosis Date Noted  . Depression 12/04/2018  . Seizures ([place]) 12/04/2018    Past Surgical History:  Procedure Laterality Date  . DILATION AND CURETTAGE OF UTERUS    . WRIST GANGLION EXCISION      OB History   No obstetric history on file.      Home Medications    Prior to Admission medications   Medication Sig Start Date End Date Taking? Authorizing Provider  escitalopram (LEXAPRO) 5 MG/5ML solution Take 10 mLs (10 mg total) by mouth daily. 12/04/18   Maximiano Coss, NP  ibuprofen (ADVIL,MOTRIN) 600 MG tablet Take 1 tablet (600 mg total) by mouth every 6 (six) hours as needed. 07/14/17   Langston Masker B, PA-C  Multiple Vitamin (MULTIVITAMIN) tablet Take 1 tablet by mouth daily.    [provider]    Family History Family History  Problem Relation Age of Onset  . Hypertension Mother     Social History Social History   Tobacco Use  . Smoking status: Former Smoker    Types: Cigars    Quit date: 08/09/2015    Years since quitting: 4.0  . Smokeless tobacco: Never Used  Substance Use Topics  . Alcohol use: Yes    Comment: occasional  . Drug use: No     Allergies   Calamine   Review of Systems Review of Systems    Constitutional: Negative for chills, fatigue and fever.  HENT: Negative for congestion, ear pain, rhinorrhea, sinus pressure, sinus pain and sore throat.   Eyes: Negative for pain and visual disturbance.  Respiratory: Negative for cough and shortness of breath.   Cardiovascular: Negative for chest pain and palpitations.  Gastrointestinal: Negative for abdominal pain, diarrhea, nausea and vomiting.  Genitourinary: Negative for dysuria and hematuria.  Musculoskeletal: Negative for arthralgias, back pain and myalgias.  Skin: Negative for color change and rash.  Neurological: Negative for headaches.  All other systems reviewed and are negative.    Physical Exam Triage Vital Signs ED Triage Vitals  Enc Vitals Group     BP 08/22/19 1556 126/74     Pulse Rate 08/22/19 1556 83     Resp 08/22/19 1556 19     Temp 08/22/19 1556 98.7 F (37.1 C)     Temp Source 08/22/19 1556 Oral     SpO2 08/22/19 1556 100 %     Weight --      Height --      Head Circumference --      Peak Flow --      Pain Score 08/22/19 1553 0     Pain Loc --  Pain Edu? --      Excl. in [place]? --    No data found.  Updated Vital Signs BP 126/74 (BP Location: Left Arm)   Pulse 83   Temp 98.7 F (37.1 C) (Oral)   Resp 19   LMP 07/28/2019   SpO2 100%   Visual Acuity Right Eye Distance:   Left Eye Distance:   Bilateral Distance:    Right Eye Near:   Left Eye Near:    Bilateral Near:     Physical Exam Vitals and nursing note reviewed.  Constitutional:      General: She is not in acute distress.    Appearance: She is well-developed. She is not ill-appearing.  HENT:     Head: Normocephalic and atraumatic.  Eyes:     Extraocular Movements: Extraocular movements intact.     Conjunctiva/sclera: Conjunctivae normal.     Pupils: Pupils are equal, round, and reactive to light.  Cardiovascular:     Rate and Rhythm: Normal rate.  Pulmonary:     Effort: Pulmonary effort is normal. No respiratory distress.   Musculoskeletal:     Cervical back: Neck supple.  Skin:    General: Skin is warm and dry.  Neurological:     General: No focal deficit present.     Mental Status: She is alert and oriented to person, place, and time.      UC Treatments / Results  Labs (all labs ordered are listed, but only abnormal results are displayed) Labs Reviewed  SARS CORONAVIRUS 2 (TAT 6-24 HRS)    EKG   Radiology No results found.  Procedures Procedures (including critical care time)  Medications Ordered in UC Medications - No data to display  Initial Impression / Assessment and Plan / UC Course  I have reviewed the triage vital signs and the nursing notes.  Pertinent labs & imaging results that were available during my care of the patient were reviewed by me and considered in my medical decision making (see chart for details).     #Encounter for Covid testing Patient 34 year old presenting for Covid testing after exposure to Covid.  She is asymptomatic.  Give PCR sent.  Discussed turnaround time of this test and note given for work.  Final Clinical Impressions(s) / UC Diagnoses   Final diagnoses:  Encounter for laboratory testing for COVID-19 virus     Discharge Instructions     If your Covid-19 test is positive, you will receive a phone call from [facility] regarding your results. Negative test results are not called. Both positive and negative results area always visible on MyChart. If you do not have a MyChart account, sign up instructions are in your discharge papers.   Persons who are directed to care for themselves at home may discontinue isolation under the following conditions:  . At least 10 days have passed since symptom onset and . At least 24 hours have passed without running a fever (this means without the use of fever-reducing medications) and . Other symptoms have improved.  Persons infected with COVID-19 who never develop symptoms may discontinue isolation and  other precautions 10 days after the date of their first positive COVID-19 test.     ED Prescriptions    None     PDMP not reviewed this encounter.   Purnell Shoemaker, PA-C 08/22/19 1621

## 2019-08-23 LAB — SARS CORONAVIRUS 2 (TAT 6-24 HRS): SARS Coronavirus 2: NEGATIVE

## 2019-09-02 ENCOUNTER — Ambulatory Visit (HOSPITAL_COMMUNITY)
Admission: RE | Admit: 2019-09-02 | Discharge: 2019-09-02 | Disposition: A | Discharge status: Home / Self Care | Payer: Self-pay | Attending: Psychiatry | Admitting: Psychiatry

## 2019-09-02 DIAGNOSIS — F331 Major depressive disorder, recurrent, moderate: Secondary | ICD-10-CM

## 2019-09-02 DIAGNOSIS — F411 Generalized anxiety disorder: Secondary | ICD-10-CM

## 2019-09-02 DIAGNOSIS — Z1389 Encounter for screening for other disorder: Secondary | ICD-10-CM | POA: Insufficient documentation

## 2019-09-02 DIAGNOSIS — F419 Anxiety disorder, unspecified: Secondary | ICD-10-CM | POA: Insufficient documentation

## 2019-09-02 DIAGNOSIS — F329 Major depressive disorder, single episode, unspecified: Secondary | ICD-10-CM | POA: Insufficient documentation

## 2019-09-02 NOTE — BH Assessment (Addendum)
Assessment Note  Traci Rogers is an 34 y.o. female, who presents voluntary and unaccompanied to [facility]. Clinician asked the pt, "what brought you to the hospital?" Pt reported, yesterday she had a breakdown. Pt reported, she holds stuff in. Pt reported, from March 19- April 3rd, every year she feels depressed and drained (during that time her mother went in a coma, passed away and the family had her funeral.) Pt reported, outside of the time, she's fine but she wants to learn how to cope with the death of her mother. Pt reported, on Sunday she had a thought of wanting to jump off a bridge over water, but knows that isn't right. Pt reported, she is not currently suicidal. Pt reported, around this time she also deals with anxiety. Pt also denies, HI, AVH, self-injurious behaviors and access to weapons.   Pt denies, substance use. Pt had a counseling session at [facility] today. Pt reported, she has another appointment scheduled for Friday March 09/04/2019. Pt denies, previous inpatient treatment admissions.   Pt presents alert with logical, coherent, speech. Pt's eye contact was good. Pt's mood, affect was depressed. Pt's thought process was coherent, relevant. Pt's judgement was unimpaired. Pt's concentration was normal. Pt's insight and impulse control was good. Pt reported, if discharged from [facility] she could contract for safety.   *Initially pt consented for clinician to call her father. Pt then declined for clinician to call her father to obtain collateral information.*  Diagnosis: Major Depressive Disorder, recurrent, moderate without psychotic features.                      GAD.  Past Medical History:  Past Medical History:  Diagnosis Date  . Anxiety   . Arrhythmia   . Asthma   . Depression   . Insomnia   . PTSD (post-traumatic stress disorder)     Past Surgical History:  Procedure Laterality Date  . DILATION AND CURETTAGE OF UTERUS    . WRIST GANGLION EXCISION      Family  History:  Family History  Problem Relation Age of Onset  . Hypertension Mother     Social History:  reports that she quit smoking about 4 years ago. Her smoking use included cigars. She has never used smokeless tobacco. She reports current alcohol use. She reports that she does not use drugs.  Additional Social History:  Alcohol / Drug Use Pain Medications: See MAR Prescriptions: See MAR Over the Counter: See MAR History of alcohol / drug use?: No history of alcohol / drug abuse  CIWA: CIWA-Ar BP: (!) 138/97 Pulse Rate: 99 COWS:    Allergies:  Allergies  Allergen Reactions  . Calamine Hives and Swelling    "Have you ever seen [facility]?"    Home Medications: (Not in a hospital admission)   OB/GYN Status:  No LMP recorded.  General Assessment Data Location of Assessment: [facility] Assessment Services TTS Assessment: In system Is this a Tele or Face-to-Face Assessment?: Face-to-Face Is this an Initial Assessment or a Re-assessment for this encounter?: Initial Assessment Patient Accompanied by:: N/A Language Other than English: No Living Arrangements: Other (Comment)(Sister. ) What gender do you identify as?: Female Marital status: Single Living Arrangements: Other relatives Can pt return to current living arrangement?: Yes Admission Status: Voluntary Is patient capable of signing voluntary admission?: Yes Referral Source: Self/Family/Friend Insurance type: Self-pay.   Medical Screening Exam ([place]) Medical Exam completed: Yes  Crisis Care Plan Living Arrangements: Other relatives Legal  Guardian: Other:(Self.) Name of Psychiatrist: NA Name of Therapist: [facility].   Education Status Is patient currently in school?: No Is the patient employed, unemployed or receiving disability?: Employed  Risk to self with the past 6 months Suicidal Ideation: No-Not Currently/Within Last 6 Months Has patient been a risk to self within the past 6 months prior to admission? :  Yes Suicidal Intent: No-Not Currently/Within Last 6 Months Has patient had any suicidal intent within the past 6 months prior to admission? : Yes Is patient at risk for suicide?: No Suicidal Plan?: No-Not Currently/Within Last 6 Months Has patient had any suicidal plan within the past 6 months prior to admission? : Yes Access to Means: Yes Specify Access to Suicidal Means: Pt has access to bridges.  What has been your use of drugs/alcohol within the last 12 months?: Pt denies use.  Previous Attempts/Gestures: No How many times?: 0 Other Self Harm Risks: Pt denies.  Triggers for Past Attempts: None known Intentional Self Injurious Behavior: None(Pt denies. ) Family Suicide History: Yes(Pt reported, her cousin commited suicide couple years ago. ) Recent stressful life event(s): Other (Comment)(Death of her mother. ) Persecutory voices/beliefs?: No Depression: Yes Depression Symptoms: Feeling angry/irritable, Loss of interest in usual pleasures, Isolating, Insomnia, Despondent Substance abuse history and/or treatment for substance abuse?: No(Pt denies. ) Suicide prevention information given to non-admitted patients: Not applicable  Risk to Others within the past 6 months Homicidal Ideation: No(Pt denies. ) Does patient have any lifetime risk of violence toward others beyond the six months prior to admission? : No(Pt denies.) Thoughts of Harm to Others: No(Pt denies. ) Current Homicidal Intent: No Current Homicidal Plan: No Access to Homicidal Means: No Identified Victim: NA History of harm to others?: No(Pt denies. ) Assessment of Violence: None Noted Violent Behavior Description: NA Does patient have access to weapons?: No(Pt denies. ) Criminal Charges Pending?: No Does patient have a court date: No Is patient on probation?: No  Psychosis Hallucinations: None noted Delusions: None noted  Mental Status Report Appearance/Hygiene: Unremarkable Eye Contact: Good Motor Activity:  Unremarkable Speech: Logical/coherent Level of Consciousness: Alert Mood: Depressed Affect: Depressed Anxiety Level: Panic Attacks Panic attack frequency: Pt reported, it depends.  Most recent panic attack: UTA Thought Processes: Coherent, Relevant Judgement: Unimpaired Orientation: Person, Place, Time, Situation Obsessive Compulsive Thoughts/Behaviors: None  Cognitive Functioning Concentration: Normal Memory: Recent Intact Is patient IDD: No Insight: Good Impulse Control: Good Appetite: Fair Sleep: (Pt reported, having insomnia. ) Vegetative Symptoms: None  ADLScreening [facility] Assessment Services) Patient's cognitive ability adequate to safely complete daily activities?: Yes Patient able to express need for assistance with ADLs?: Yes Independently performs ADLs?: Yes (appropriate for developmental age)  Prior Inpatient Therapy Prior Inpatient Therapy: No  Prior Outpatient Therapy Prior Outpatient Therapy: Yes Prior Therapy Dates: Current.  Prior Therapy Facilty/Provider(s): [facility].  Reason for Treatment: Counseling.  Does patient have an ACCT team?: No Does patient have Intensive In-House Services?  : No Does patient have [facility] services? : Yes Does patient have P4CC services?: No  ADL Screening (condition at time of admission) Patient's cognitive ability adequate to safely complete daily activities?: Yes Is the patient deaf or have difficulty hearing?: No Does the patient have difficulty seeing, even when wearing glasses/contacts?: No Does the patient have difficulty concentrating, remembering, or making decisions?: No Patient able to express need for assistance with ADLs?: Yes Does the patient have difficulty dressing or bathing?: No Independently performs ADLs?: Yes (appropriate for developmental age) Does the patient have difficulty  walking or climbing stairs?: Yes Weakness of Legs: None Weakness of Arms/Hands: None  Home Assistive Devices/Equipment Home  Assistive Devices/Equipment: None    Abuse/Neglect Assessment (Assessment to be complete while patient is alone) Abuse/Neglect Assessment Can Be Completed: Yes Physical Abuse: Denies Verbal Abuse: Denies Sexual Abuse: Denies Exploitation of patient/patient's resources: Denies Self-Neglect: Denies     Regulatory affairs officer (For Healthcare) Does Patient Have a Medical Advance Directive?: No          Disposition: Adaku Anike, NP recommends pt does not meet inpatient treatment criteria. Pt is to follow up with [facility] on her scheduled appointment on Friday  (09/04/2019.) Clinician provided pt additional OPT resources including Mobile Crisis.   Disposition Initial Assessment Completed for this Encounter: Yes  On Site Evaluation by: Vertell Novak, MS, [facility], [facility]. Reviewed with Physician:  Talbot Grumbling, NP.  Vertell Novak 09/02/2019 8:50 PM     Vertell Novak, [place], [facility], [facility] Triage Specialist [phone]

## 2019-09-03 NOTE — H&P (Signed)
Behavioral Health Medical Screening Exam  Traci Rogers is an 34 y.o. female who presents to [facility] as a walk-in unaccompanied with complaints of depression. Pt reports she has been very emotional today having crying spells. Pt reports that the 5 year anniversary of her mother is coming up on 3/30 and she usually experience this at this time yearly. Pt states she needs resources to cope with depression and anxiety relating to her mother's death. She was seen at [facility] for counseling for the first time today and has an upcoming appointment on Friday. Pt denies SI, HI. SH, AVH and prior suicidal attempt. She denies access to guns or weapons. Denies any drug or alcohol use.   During evaluation pt is sitting; she is alert/oriented x 4; cooperative; and mood is depressed/anxious congruent with affect. Pt is speaking in a clear tone at moderate volume, and normal pace; with good eye contact. Her thought process is coherent and relevant; There is no indication that she is currently responding to internal/external stimuli or experiencing delusional thought content. Pt's insight, judgement and impulse control is intact.   Total Time spent with patient: 30 minutes  Psychiatric Specialty Exam: Physical Exam  Constitutional: She is oriented to person, place, and time. She appears well-developed and well-nourished.  HENT:  Head: Normocephalic.  Eyes: Pupils are equal, round, and reactive to light.  Respiratory: Effort normal.  Musculoskeletal:        General: Normal range of motion.     Cervical back: Normal range of motion.  Neurological: She is alert and oriented to person, place, and time.  Skin: Skin is warm and dry.  Psychiatric: Her speech is normal and behavior is normal. Judgment and thought content normal. Her mood appears anxious. Cognition and memory are normal. She exhibits a depressed mood.    Review of Systems  Psychiatric/Behavioral: Positive for dysphoric mood. Negative for behavioral  problems, confusion, decreased concentration, hallucinations and suicidal ideas. The patient is nervous/anxious. The patient is not hyperactive.   All other systems reviewed and are negative.   Blood pressure (!) 138/97, pulse 99, temperature 98.4 F (36.9 C), temperature source Oral, resp. rate 20, SpO2 100 %.There is no height or weight on file to calculate BMI.  General Appearance: Casual  Eye Contact:  Good  Speech:  Normal Rate  Volume:  Normal  Mood:  Anxious and Depressed  Affect:  Congruent and Depressed  Thought Process:  Coherent and Descriptions of Associations: Intact  Orientation:  Full (Time, Place, and Person)  Thought Content:  WDL  Suicidal Thoughts:  No  Homicidal Thoughts:  No  Memory:  Recent;   Good  Judgement:  Good  Insight:  Good  Psychomotor Activity:  Normal  Concentration: Concentration: Good  Recall:  Good  Fund of Knowledge:Good  Language: Good  Akathisia:  No  Handed:  Right  AIMS (if indicated):     Assets:  Communication Skills Desire for Improvement Financial Resources/Insurance Housing Social Support Transportation Vocational/Educational  Sleep:       Musculoskeletal: Strength & Muscle Tone: within normal limits Gait & Station: normal Patient leans: N/A  Blood pressure (!) 138/97, pulse 99, temperature 98.4 F (36.9 C), temperature source Oral, resp. rate 20, SpO2 100 %.  Recommendations:  Based on my evaluation the patient does not appear to have an emergency medical condition.   Disposition: No evidence of imminent risk to self or others at present.   Patient does not meet criteria for psychiatric inpatient admission. Supportive therapy provided  about ongoing stressors. Discussed crisis plan, support from social network, calling 911, coming to the Emergency Department, and calling Suicide Hotline.  Mliss Fritz, NP 09/03/2019, 2:15 AM

## 2019-10-03 ENCOUNTER — Encounter (HOSPITAL_BASED_OUTPATIENT_CLINIC_OR_DEPARTMENT_OTHER): Payer: Self-pay | Admitting: Emergency Medicine

## 2019-10-03 ENCOUNTER — Emergency Department (HOSPITAL_BASED_OUTPATIENT_CLINIC_OR_DEPARTMENT_OTHER)
Admission: EM | Admit: 2019-10-03 | Discharge: 2019-10-03 | Disposition: A | Discharge status: Home / Self Care | Payer: Self-pay | Attending: Emergency Medicine | Admitting: Emergency Medicine

## 2019-10-03 ENCOUNTER — Emergency Department (HOSPITAL_BASED_OUTPATIENT_CLINIC_OR_DEPARTMENT_OTHER): Payer: Self-pay

## 2019-10-03 DIAGNOSIS — Y9259 Other trade areas as the place of occurrence of the external cause: Secondary | ICD-10-CM | POA: Insufficient documentation

## 2019-10-03 DIAGNOSIS — Y990 Civilian activity done for income or pay: Secondary | ICD-10-CM | POA: Insufficient documentation

## 2019-10-03 DIAGNOSIS — Z79899 Other long term (current) drug therapy: Secondary | ICD-10-CM | POA: Insufficient documentation

## 2019-10-03 DIAGNOSIS — Y9389 Activity, other specified: Secondary | ICD-10-CM | POA: Insufficient documentation

## 2019-10-03 DIAGNOSIS — X500XXA Overexertion from strenuous movement or load, initial encounter: Secondary | ICD-10-CM | POA: Insufficient documentation

## 2019-10-03 DIAGNOSIS — M5441 Lumbago with sciatica, right side: Secondary | ICD-10-CM | POA: Insufficient documentation

## 2019-10-03 DIAGNOSIS — Z87891 Personal history of nicotine dependence: Secondary | ICD-10-CM | POA: Insufficient documentation

## 2019-10-03 LAB — PREGNANCY, URINE: Preg Test, Ur: NEGATIVE

## 2019-10-03 MED ORDER — NAPROXEN 375 MG PO TABS
ORAL_TABLET | ORAL | 0 refills | Status: DC
Start: 2019-10-03 — End: 2020-01-20

## 2019-10-03 MED ORDER — HYDROCODONE-ACETAMINOPHEN 5-325 MG PO TABS: 1.0000 | ORAL_TABLET | ORAL | 0 refills | Status: DC | PRN

## 2019-10-03 MED ORDER — NAPROXEN 250 MG PO TABS
500.0000 mg | ORAL_TABLET | Freq: Once | ORAL | Status: AC
  Administered 2019-10-03: 500 mg via ORAL
  Filled 2019-10-03: qty 2

## 2019-10-03 NOTE — ED Provider Notes (Signed)
[place] DEPT [facility] Provider Note: Georgena Spurling, MD, FACEP  CSN: [id number] MRN: [id number] ARRIVAL: 10/03/19 at [place]: [place]  Back Injury   HISTORY OF PRESENT ILLNESS  10/03/19 4:49 AM Traci Rogers is a 34 y.o. 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  She now has pain in her mid lower back that radiates around to her pelvic area and down into her right buttock.  She rates her pain as a 10 out of 10, sharp in nature.  Is worse with movement.  She is able to ambulate.  She denies numbness, weakness, bowel or bladder changes.   Past Medical History:  Diagnosis Date  . Anxiety   . Arrhythmia   . Asthma   . Depression   . Insomnia   . PTSD (post-traumatic stress disorder)     Past Surgical History:  Procedure Laterality Date  . DILATION AND CURETTAGE OF UTERUS    . WRIST GANGLION EXCISION      Family History  Problem Relation Age of Onset  . Hypertension Mother     Social History   Tobacco Use  . Smoking status: Former Smoker    Types: Cigars    Quit date: 08/09/2015    Years since quitting: 4.1  . Smokeless tobacco: Never Used  Substance Use Topics  . Alcohol use: Yes    Comment: occasional  . Drug use: No    Prior to Admission medications   Medication Sig Start Date End Date Taking? Authorizing Provider  escitalopram (LEXAPRO) 5 MG/5ML solution Take 10 mLs (10 mg total) by mouth daily. 12/04/18   Maximiano Coss, NP  HYDROcodone-acetaminophen (NORCO) 5-325 MG tablet Take 1 tablet by mouth every 4 (four) hours as needed for severe pain. 10/03/19   Chaka Jefferys, MD  Multiple Vitamin (MULTIVITAMIN) tablet Take 1 tablet by mouth daily.    [provider]  naproxen (NAPROSYN) 375 MG tablet Take 1 tablet twice daily for back pain. 10/03/19   Toniann Dickerson, MD    Allergies Calamine   REVIEW OF SYSTEMS  Negative except as noted here or in the History of  Present Illness.   PHYSICAL EXAMINATION  Initial Vital Signs Blood pressure 116/83, pulse 75, temperature 98.5 F (36.9 C), temperature source Oral, resp. rate 18, height 5\' 2"  (1.575 m), weight 101.2 kg, last menstrual period 09/19/2019, SpO2 100 %.  Examination General: Well-developed, well-nourished female in no acute distress; appearance consistent with age of record HENT: normocephalic; atraumatic Eyes: Normal appearance Neck: supple Heart: regular rate and rhythm Lungs: clear to auscultation bilaterally Abdomen: soft; nondistended; nontender; bowel sounds present Back: Lower midline lumbar tenderness; pain on movement of lower back Extremities: No deformity; full range of motion; pulses normal Neurologic: Awake, alert and oriented; motor function intact in all extremities and symmetric; sensation intact and symmetric in lower extremities; no facial droop Skin: Warm and dry Psychiatric: Normal mood and affect   RESULTS  Summary of this visit's results, reviewed and interpreted by myself:   EKG Interpretation  Date/Time:    Ventricular Rate:    PR Interval:    QRS Duration:   QT Interval:    QTC Calculation:   R Axis:     Text Interpretation:        Laboratory Studies: Results for orders placed or performed during the hospital encounter of 10/03/19 (from the past 24 hour(s))  Pregnancy, urine  Status: None   Collection Time: 10/03/19  5:05 AM  Result Value Ref Range   Preg Test, Ur NEGATIVE NEGATIVE   Imaging Studies: DG Lumbar Spine Complete  Result Date: 10/03/2019 CLINICAL DATA:  Initial evaluation for acute low back pain status post lifting injury. EXAM: LUMBAR SPINE - COMPLETE 4+ VIEW COMPARISON:  None. FINDINGS: There is no evidence of lumbar spine fracture. Levoscoliosis. Alignment otherwise normal with preservation of the normal lumbar lordosis. Intervertebral disc spaces are maintained. IMPRESSION: 1. No radiographic evidence for acute abnormality  within the lumbar spine. 2. Levoscoliosis. Electronically Signed   By: Jeannine Boga M.D.   On: 10/03/2019 05:46    ED COURSE and MDM  Nursing notes, initial and subsequent vitals signs, including pulse oximetry, reviewed and interpreted by myself.  Vitals:   10/03/19 0440  BP: 116/83  Pulse: 75  Resp: 18  Temp: 98.5 F (36.9 C)  TempSrc: Oral  SpO2: 100%  Weight: 101.2 kg  Height: 5\' 2"  (1.575 m)   Medications  naproxen (NAPROSYN) tablet 500 mg (has no administration in time range)    No radiographic evidence of acute bony injury.  We will treat patient's pain and refer to to sports medicine if symptoms persist.  PROCEDURES  Procedures   ED DIAGNOSES     ICD-10-CM   1. Acute midline low back pain with right-sided sciatica  M54.41        Evangelynn Lochridge, Jenny Reichmann, MD 10/03/19 [phone]

## 2019-10-03 NOTE — ED Triage Notes (Signed)
Pt states she was lifting a bag at work and felt a pop in her lower back on the right side  Pt states now she has pain in her lower back that radiates around to her pelvic area and down into her right buttock

## 2019-10-07 ENCOUNTER — Encounter (HOSPITAL_BASED_OUTPATIENT_CLINIC_OR_DEPARTMENT_OTHER): Payer: Self-pay | Admitting: Emergency Medicine

## 2019-10-07 ENCOUNTER — Other Ambulatory Visit: Payer: Self-pay

## 2019-10-07 DIAGNOSIS — X500XXD Overexertion from strenuous movement or load, subsequent encounter: Secondary | ICD-10-CM | POA: Insufficient documentation

## 2019-10-07 DIAGNOSIS — Z79899 Other long term (current) drug therapy: Secondary | ICD-10-CM | POA: Insufficient documentation

## 2019-10-07 DIAGNOSIS — M62830 Muscle spasm of back: Secondary | ICD-10-CM | POA: Insufficient documentation

## 2019-10-07 DIAGNOSIS — Z87891 Personal history of nicotine dependence: Secondary | ICD-10-CM | POA: Insufficient documentation

## 2019-10-07 DIAGNOSIS — J45909 Unspecified asthma, uncomplicated: Secondary | ICD-10-CM | POA: Insufficient documentation

## 2019-10-07 NOTE — ED Triage Notes (Signed)
Patient presents with complaints of right lower back pain; seen here 4/24 for same; states pain slightly improved at times. States pain worse when sedentary or sleeping. Ambulatory with steady gait to triage.

## 2019-10-08 ENCOUNTER — Emergency Department (HOSPITAL_BASED_OUTPATIENT_CLINIC_OR_DEPARTMENT_OTHER)
Admission: EM | Admit: 2019-10-08 | Discharge: 2019-10-08 | Disposition: A | Discharge status: Home / Self Care | Payer: Self-pay | Attending: Emergency Medicine | Admitting: Emergency Medicine

## 2019-10-08 DIAGNOSIS — M62830 Muscle spasm of back: Secondary | ICD-10-CM

## 2019-10-08 MED ORDER — DIAZEPAM 5 MG PO TABS: 5.0000 mg | ORAL_TABLET | Freq: Every evening | ORAL | 0 refills | Status: DC | PRN

## 2019-10-08 NOTE — ED Provider Notes (Signed)
[place] DEPT [facility] Provider Note: Georgena Spurling, MD, FACEP  CSN: [id number] MRN: [id number] ARRIVAL: 10/07/19 at 2231 ROOM: [place]  Back Pain   HISTORY OF PRESENT ILLNESS  10/08/19 2:15 AM Traci Rogers is a 34 y.o. female who was seen by myself on 10/03/2019 for lower back pain after lifting a bag of horse feet at work.  She felt a pop in her right lower back at that time and was complaining of mid lower back pain that radiated around to her pelvic area and down into her right buttock she rated her pain that is a 10 out of 10, sharp in nature, worse with movement.  She had no neurologic, bowel or bladder changes.  Radiographs of the lumbar spine at that time were unremarkable.  She was prescribed hydrocodone and naproxen.  She returns complaining of right lower back pain which has somewhat improved but worse when lying still or sleeping.  She rates her pain as a 7 out of 10, tingling, tight and sharp in nature.   Past Medical History:  Diagnosis Date  . Anxiety   . Arrhythmia   . Asthma   . Depression   . Insomnia   . PTSD (post-traumatic stress disorder)     Past Surgical History:  Procedure Laterality Date  . DILATION AND CURETTAGE OF UTERUS    . WRIST GANGLION EXCISION      Family History  Problem Relation Age of Onset  . Hypertension Mother     Social History   Tobacco Use  . Smoking status: Former Smoker    Types: Cigars    Quit date: 08/09/2015    Years since quitting: 4.1  . Smokeless tobacco: Never Used  Substance Use Topics  . Alcohol use: Yes    Comment: occasional  . Drug use: No    Prior to Admission medications   Medication Sig Start Date End Date Taking? Authorizing Provider  diazepam (VALIUM) 5 MG tablet Take 1-2 tablets (5-10 mg total) by mouth at bedtime as needed for muscle spasms. 10/08/19   Dominiqua Cooner, MD  escitalopram (LEXAPRO) 5 MG/5ML solution Take 10 mLs (10 mg total) by mouth daily. 12/04/18   Maximiano Coss, NP  HYDROcodone-acetaminophen (NORCO) 5-325 MG tablet Take 1 tablet by mouth every 4 (four) hours as needed for severe pain. 10/03/19   Maansi Wike, MD  Multiple Vitamin (MULTIVITAMIN) tablet Take 1 tablet by mouth daily.    [provider]  naproxen (NAPROSYN) 375 MG tablet Take 1 tablet twice daily for back pain. 10/03/19   Vanderbilt Ranieri, MD    Allergies Calamine   REVIEW OF SYSTEMS  Negative except as noted here or in the History of Present Illness.   PHYSICAL EXAMINATION  Initial Vital Signs Blood pressure 132/80, pulse 88, temperature 98.9 F (37.2 C), temperature source Oral, resp. rate 20, height 5\' 2"  (1.575 m), weight 101 kg, last menstrual period 09/19/2019, SpO2 99 %.  Examination General: Well-developed, well-nourished female in no acute distress; appearance consistent with age of record HENT: normocephalic; atraumatic Eyes: Normal appearance Neck: supple Heart: regular rate and rhythm Lungs: clear to auscultation bilaterally Abdomen: soft; nondistended; nontender; bowel sounds present Back: Right paralumbar soft tissue tenderness Extremities: No deformity; full range of motion; pulses normal Neurologic: Awake, alert and oriented; motor function intact in all extremities and symmetric; no facial droop Skin: Warm and dry Psychiatric: Normal mood and affect   RESULTS  Summary of this visit's results, reviewed  and interpreted by myself:   EKG Interpretation  Date/Time:    Ventricular Rate:    PR Interval:    QRS Duration:   QT Interval:    QTC Calculation:   R Axis:     Text Interpretation:        Laboratory Studies: No results found for this or any previous visit (from the past 24 hour(s)). Imaging Studies: No results found.  ED COURSE and MDM  Nursing notes, initial and subsequent vitals signs, including pulse oximetry, reviewed and interpreted by myself.  Vitals:   10/07/19 2254 10/07/19 2256 10/08/19 0100  BP:  128/83 132/80   Pulse:  98 88  Resp:  18 20  Temp:  98.9 F (37.2 C)   TempSrc:  Oral   SpO2:  100% 99%  Weight: 101 kg    Height: 5\' 2"  (1.575 m)     Medications - No data to display  I suspect patient may be having muscle spasms.  We will try a short course of Valium at night to help her sleep and to help relax her muscles.  PROCEDURES  Procedures   ED DIAGNOSES     ICD-10-CM   1. Muscle spasm of back  M62.830        Abas Leicht, Jenny Reichmann, MD 10/08/19 [phone]

## 2019-12-18 ENCOUNTER — Emergency Department (HOSPITAL_BASED_OUTPATIENT_CLINIC_OR_DEPARTMENT_OTHER)
Admission: EM | Admit: 2019-12-18 | Discharge: 2019-12-18 | Disposition: A | Discharge status: Home / Self Care | Payer: Self-pay | Attending: Emergency Medicine | Admitting: Emergency Medicine

## 2019-12-18 ENCOUNTER — Other Ambulatory Visit: Payer: Self-pay

## 2019-12-18 DIAGNOSIS — Z87891 Personal history of nicotine dependence: Secondary | ICD-10-CM | POA: Insufficient documentation

## 2019-12-18 DIAGNOSIS — J45909 Unspecified asthma, uncomplicated: Secondary | ICD-10-CM | POA: Insufficient documentation

## 2019-12-18 DIAGNOSIS — J029 Acute pharyngitis, unspecified: Secondary | ICD-10-CM

## 2019-12-18 DIAGNOSIS — H9203 Otalgia, bilateral: Secondary | ICD-10-CM

## 2019-12-18 MED ORDER — FLUCONAZOLE 200 MG PO TABS
ORAL_TABLET | ORAL | 0 refills | Status: DC
Start: 2019-12-18 — End: 2020-01-20

## 2019-12-18 MED ORDER — AMOXICILLIN 500 MG PO CAPS
500.0000 mg | ORAL_CAPSULE | Freq: Two times a day (BID) | ORAL | 0 refills | Status: AC
Start: 2019-12-18 — End: 2019-12-25

## 2019-12-18 MED ORDER — AMOXICILLIN 500 MG PO CAPS
500.0000 mg | ORAL_CAPSULE | Freq: Once | ORAL | Status: AC
  Administered 2019-12-18: 500 mg via ORAL
  Filled 2019-12-18: qty 1

## 2019-12-18 NOTE — ED Notes (Signed)
Pt discharged to home NAD. Pt swallowed amoxicillin capsule without any difficulty.

## 2019-12-18 NOTE — ED Triage Notes (Signed)
PT presents with c/o bilateral ear pain and HOH with pain. Pt state she took motrin without relief.

## 2019-12-18 NOTE — ED Provider Notes (Signed)
[place] EMERGENCY DEPARTMENT Provider Note   CSN: [id number] Arrival date & time: 12/18/19  1032     History Chief Complaint  Patient presents with  . Otalgia    Traci Rogers is a 34 y.o. female past medical story of asthma, depression, PTSD who presents for evaluation of bilateral ear pain, sore throat x3 to 4 days.  She reports that initially, she felt like her right ear was hurting.  And spread to both ears and she started having some sore throat.  She reports she is able to swallow and tolerate secretions but states it is more painful when she swallows.  She has taken Motrin with minimal improvement.  She states that she has not stuck anything in her ear but has used her fingernail to clean out her ear.  She also used some hydrogen peroxide.  She states she has not noticed any drainage from the ear.  She has not noted any fevers.  She denies any cough, congestion.  The history is provided by the patient.       Past Medical History:  Diagnosis Date  . Anxiety   . Arrhythmia   . Asthma   . Depression   . Insomnia   . PTSD (post-traumatic stress disorder)     Patient Active Problem List   Diagnosis Date Noted  . Depression 12/04/2018  . Seizures ([place]) 12/04/2018    Past Surgical History:  Procedure Laterality Date  . DILATION AND CURETTAGE OF UTERUS    . WRIST GANGLION EXCISION       OB History   No obstetric history on file.     Family History  Problem Relation Age of Onset  . Hypertension Mother     Social History   Tobacco Use  . Smoking status: Former Smoker    Types: Cigars    Quit date: 08/09/2015    Years since quitting: 4.3  . Smokeless tobacco: Never Used  Vaping Use  . Vaping Use: Never used  Substance Use Topics  . Alcohol use: Yes    Comment: occasional  . Drug use: No    Home Medications Prior to Admission medications   Medication Sig Start Date End Date Taking? Authorizing Provider  amoxicillin (AMOXIL) 500 MG  capsule Take 1 capsule (500 mg total) by mouth 2 (two) times daily for 7 days. 12/18/19 12/25/19  Volanda Napoleon, PA-C  diazepam (VALIUM) 5 MG tablet Take 1-2 tablets (5-10 mg total) by mouth at bedtime as needed for muscle spasms. 10/08/19   Molpus, John, MD  escitalopram (LEXAPRO) 5 MG/5ML solution Take 10 mLs (10 mg total) by mouth daily. 12/04/18   Maximiano Coss, NP  fluconazole (DIFLUCAN) 200 MG tablet Take 1 tablet by mouth after antibiotics 12/18/19   Volanda Napoleon, PA-C  HYDROcodone-acetaminophen (NORCO) 5-325 MG tablet Take 1 tablet by mouth every 4 (four) hours as needed for severe pain. 10/03/19   Molpus, John, MD  Multiple Vitamin (MULTIVITAMIN) tablet Take 1 tablet by mouth daily.    [provider]  naproxen (NAPROSYN) 375 MG tablet Take 1 tablet twice daily for back pain. 10/03/19   Molpus, Jenny Reichmann, MD    Allergies    Calamine  Review of Systems   Review of Systems  Constitutional: Negative for fever.  HENT: Positive for ear pain and sore throat. Negative for congestion.   Respiratory: Negative for cough.   All other systems reviewed and are negative.   Physical Exam Updated Vital Signs BP  121/84 (BP Location: Right Arm)   Pulse 88   Temp 99 F (37.2 C) (Oral)   Resp 18   LMP 12/10/2019   SpO2 100%   Physical Exam Vitals and nursing note reviewed.  Constitutional:      Appearance: She is well-developed.  HENT:     Head: Normocephalic and atraumatic.     Comments: Face is symmetric in appearance without any overlying warmth, erythema, edema.    Right Ear: Tympanic membrane normal. No mastoid tenderness.     Left Ear: Tympanic membrane normal. No mastoid tenderness.     Ears:     Comments: Bilateral TMs are without any erythema, edema, bulging.  No warmth, erythema, tenderness noted to mastoid process bilaterally.    Mouth/Throat:     Pharynx: Posterior oropharyngeal erythema present.     Tonsils: Tonsillar exudate present.     Comments: Posterior  oropharynx is erythematous/edematous.  There is evidence of tonsillar exudates noted, particularly on the left.  Uvula is midline.  Airways patent, phonation is intact.  No oral angioedema. Eyes:     General: No scleral icterus.       Right eye: No discharge.        Left eye: No discharge.     Conjunctiva/sclera: Conjunctivae normal.  Pulmonary:     Effort: Pulmonary effort is normal.  Lymphadenopathy:     Cervical: Cervical adenopathy present.  Skin:    General: Skin is warm and dry.  Neurological:     Mental Status: She is alert.  Psychiatric:        Speech: Speech normal.        Behavior: Behavior normal.     ED Results / Procedures / Treatments   Labs (all labs ordered are listed, but only abnormal results are displayed) Labs Reviewed - No data to display  EKG None  Radiology No results found.  Procedures Procedures (including critical care time)  Medications Ordered in ED Medications  amoxicillin (AMOXIL) capsule 500 mg (500 mg Oral Given 12/18/19 1122)    ED Course  I have reviewed the triage vital signs and the nursing notes.  Pertinent labs & imaging results that were available during my care of the patient were reviewed by me and considered in my medical decision making (see chart for details).    MDM Rules/Calculators/A&P                          34 year old female who presents for evaluation of bilateral ear pain, sore throat x3 to 4 days.  No fevers.  She is able to tolerate secretions but does report worsening pain with swallowing.  On initial arrival, she is afebrile, nontoxic-appearing.  Vital signs are stable.  On exam, bilateral TMs are without erythema, edema, bulging.  Exam not concerning for acute otitis media, acute otitis externa, mastoiditis.  Her posterior oropharynx is erythematous, edematous with evidence of tonsillar exudates.  Uvula is midline.  Airways patent.  Concern for strep pharyngitis.  History/physical exam not concerning for Ludwig  angina, peritonsillar abscess.  Patient also with some cervical lymphadenopathy.  We will plan to treat her per [facility] criteria.  Patient with no known drug allergies. At this time, patient exhibits no emergent life-threatening condition that require further evaluation in ED or admission. Patient had ample opportunity for questions and discussion. All patient's questions were answered with full understanding. Strict return precautions discussed. Patient expresses understanding and agreement to plan.   Portions  of this note were generated with [employer]. Dictation errors may occur despite best attempts at proofreading.  Final Clinical Impression(s) / ED Diagnoses Final diagnoses:  Ear pain, bilateral  Pharyngitis, unspecified etiology    Rx / DC Orders ED Discharge Orders         Ordered    amoxicillin (AMOXIL) 500 MG capsule  2 times daily     Discontinue  Reprint     12/18/19 1116    fluconazole (DIFLUCAN) 200 MG tablet     Discontinue  Reprint     12/18/19 1121           Desma Mcgregor 12/18/19 1129    Truddie Hidden, MD 12/18/19 1220

## 2019-12-18 NOTE — Discharge Instructions (Signed)
You can take Tylenol or Ibuprofen as directed for pain. You can alternate Tylenol and Ibuprofen every 4 hours. If you take Tylenol at 1pm, then you can take Ibuprofen at 5pm. Then you can take Tylenol again at 9pm.   Take antibiotics as directed. Please take all of your antibiotics until finished.  Make sure you drink plenty of fluids and staying hydrated.  Return the emergency department for any worsening sore throat, inability to swallow your saliva, vomiting, difficulty breathing or any other worsening or concerning symptoms.

## 2020-01-14 ENCOUNTER — Emergency Department (HOSPITAL_BASED_OUTPATIENT_CLINIC_OR_DEPARTMENT_OTHER)
Admission: EM | Admit: 2020-01-14 | Discharge: 2020-01-14 | Disposition: A | Discharge status: Home / Self Care | Payer: Self-pay | Attending: Emergency Medicine | Admitting: Emergency Medicine

## 2020-01-14 ENCOUNTER — Other Ambulatory Visit: Payer: Self-pay

## 2020-01-14 ENCOUNTER — Encounter (HOSPITAL_BASED_OUTPATIENT_CLINIC_OR_DEPARTMENT_OTHER): Payer: Self-pay | Admitting: *Deleted

## 2020-01-14 DIAGNOSIS — G43909 Migraine, unspecified, not intractable, without status migrainosus: Secondary | ICD-10-CM | POA: Insufficient documentation

## 2020-01-14 DIAGNOSIS — Z87891 Personal history of nicotine dependence: Secondary | ICD-10-CM | POA: Insufficient documentation

## 2020-01-14 DIAGNOSIS — J45909 Unspecified asthma, uncomplicated: Secondary | ICD-10-CM | POA: Insufficient documentation

## 2020-01-14 DIAGNOSIS — G43901 Migraine, unspecified, not intractable, with status migrainosus: Secondary | ICD-10-CM

## 2020-01-14 MED ORDER — DEXAMETHASONE SODIUM PHOSPHATE 10 MG/ML IJ SOLN
10.0000 mg | Freq: Once | INTRAMUSCULAR | Status: AC
  Administered 2020-01-14: 10 mg via INTRAVENOUS
  Filled 2020-01-14: qty 1

## 2020-01-14 MED ORDER — DIPHENHYDRAMINE HCL 50 MG/ML IJ SOLN
25.0000 mg | Freq: Once | INTRAMUSCULAR | Status: AC
  Administered 2020-01-14: 25 mg via INTRAVENOUS
  Filled 2020-01-14: qty 1

## 2020-01-14 MED ORDER — SODIUM CHLORIDE 0.9 % IV SOLN: INTRAVENOUS | Status: DC

## 2020-01-14 MED ORDER — SODIUM CHLORIDE 0.9 % IV BOLUS
1000.0000 mL | Freq: Once | INTRAVENOUS | Status: AC
  Administered 2020-01-14: 1000 mL via INTRAVENOUS

## 2020-01-14 MED ORDER — METOCLOPRAMIDE HCL 5 MG/ML IJ SOLN
10.0000 mg | Freq: Once | INTRAMUSCULAR | Status: AC
  Administered 2020-01-14: 10 mg via INTRAVENOUS
  Filled 2020-01-14: qty 2

## 2020-01-14 NOTE — Discharge Instructions (Signed)
Rest in a dark room overnight.  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  Follow-up with your doctors as needed.  Return for any new or worse symptoms.

## 2020-01-14 NOTE — ED Provider Notes (Signed)
[facility] EMERGENCY DEPARTMENT Provider Note   CSN: [id number] Arrival date & time: 01/14/20  1520     History Chief Complaint  Patient presents with  . Headache    Traci Rogers is a 34 y.o. female.  Patient with a history of migraines.  Complaining of headache since Sunday.  Its been behind the eyes.  Is similar to her past migraines.  Associated with photophobia nausea and dizziness.  No fevers no neck pain no low back pain.        Past Medical History:  Diagnosis Date  . Anxiety   . Arrhythmia   . Asthma   . Depression   . Insomnia   . PTSD (post-traumatic stress disorder)     Patient Active Problem List   Diagnosis Date Noted  . Depression 12/04/2018  . Seizures ([place]) 12/04/2018    Past Surgical History:  Procedure Laterality Date  . DILATION AND CURETTAGE OF UTERUS    . WRIST GANGLION EXCISION       OB History   No obstetric history on file.     Family History  Problem Relation Age of Onset  . Hypertension Mother     Social History   Tobacco Use  . Smoking status: Former Smoker    Types: Cigars    Quit date: 08/09/2015    Years since quitting: 4.4  . Smokeless tobacco: Never Used  Vaping Use  . Vaping Use: Never used  Substance Use Topics  . Alcohol use: Yes    Comment: occasional  . Drug use: No    Home Medications Prior to Admission medications   Medication Sig Start Date End Date Taking? Authorizing Provider  diazepam (VALIUM) 5 MG tablet Take 1-2 tablets (5-10 mg total) by mouth at bedtime as needed for muscle spasms. 10/08/19   Molpus, John, MD  escitalopram (LEXAPRO) 5 MG/5ML solution Take 10 mLs (10 mg total) by mouth daily. 12/04/18   Maximiano Coss, NP  fluconazole (DIFLUCAN) 200 MG tablet Take 1 tablet by mouth after antibiotics 12/18/19   Volanda Napoleon, PA-C  HYDROcodone-acetaminophen (NORCO) 5-325 MG tablet Take 1 tablet by mouth every 4 (four) hours as needed for severe pain. 10/03/19   Molpus, John, MD    Multiple Vitamin (MULTIVITAMIN) tablet Take 1 tablet by mouth daily.    [provider]  naproxen (NAPROSYN) 375 MG tablet Take 1 tablet twice daily for back pain. 10/03/19   Molpus, Jenny Reichmann, MD    Allergies    Calamine  Review of Systems   Review of Systems  Constitutional: Negative for chills and fever.  HENT: Negative for rhinorrhea and sore throat.   Eyes: Positive for photophobia. Negative for visual disturbance.  Respiratory: Negative for cough and shortness of breath.   Cardiovascular: Negative for chest pain and leg swelling.  Gastrointestinal: Positive for nausea. Negative for abdominal pain, diarrhea and vomiting.  Genitourinary: Negative for dysuria.  Musculoskeletal: Negative for back pain and neck pain.  Skin: Negative for rash.  Neurological: Positive for dizziness and headaches. Negative for light-headedness.  Hematological: Does not bruise/bleed easily.  Psychiatric/Behavioral: Negative for confusion.    Physical Exam Updated Vital Signs BP (!) 137/100 (BP Location: Right Arm)   Pulse 77   Temp 98.7 F (37.1 C) (Oral)   Resp 20   Ht 1.575 m (5\' 2" )   Wt 101 kg   LMP 01/06/2020   SpO2 98%   BMI 40.73 kg/m   Physical Exam Vitals and nursing note  reviewed.  Constitutional:      General: She is not in acute distress.    Appearance: Normal appearance. She is well-developed.  HENT:     Head: Normocephalic and atraumatic.  Eyes:     Extraocular Movements: Extraocular movements intact.     Conjunctiva/sclera: Conjunctivae normal.     Pupils: Pupils are equal, round, and reactive to light.  Cardiovascular:     Rate and Rhythm: Normal rate and regular rhythm.     Heart sounds: No murmur heard.   Pulmonary:     Effort: Pulmonary effort is normal. No respiratory distress.     Breath sounds: Normal breath sounds.  Abdominal:     Palpations: Abdomen is soft.     Tenderness: There is no abdominal tenderness.  Musculoskeletal:        General: No  swelling. Normal range of motion.     Cervical back: Normal range of motion and neck supple. No rigidity or tenderness.  Skin:    General: Skin is warm and dry.     Capillary Refill: Capillary refill takes less than 2 seconds.  Neurological:     General: No focal deficit present.     Mental Status: She is alert.     Cranial Nerves: No cranial nerve deficit.     Sensory: No sensory deficit.     Motor: No weakness.     ED Results / Procedures / Treatments   Labs (all labs ordered are listed, but only abnormal results are displayed) Labs Reviewed - No data to display  EKG None  Radiology No results found.  Procedures Procedures (including critical care time)  Medications Ordered in ED Medications  0.9 %  sodium chloride infusion (has no administration in time range)  sodium chloride 0.9 % bolus 1,000 mL (0 mLs Intravenous Stopped 01/14/20 2048)  dexamethasone (DECADRON) injection 10 mg (10 mg Intravenous Given 01/14/20 1933)  metoCLOPramide (REGLAN) injection 10 mg (10 mg Intravenous Given 01/14/20 1933)  diphenhydrAMINE (BENADRYL) injection 25 mg (25 mg Intravenous Given 01/14/20 1933)    ED Course  I have reviewed the triage vital signs and the nursing notes.  Pertinent labs & imaging results that were available during my care of the patient were reviewed by me and considered in my medical decision making (see chart for details).    MDM Rules/Calculators/A&P                          Patient treated with migraine cocktail Decadron Reglan and Benadryl.  And 1 L of normal saline.  Headache completely resolved.  Patient feeling much better.  Patient stable for discharge home.    Final Clinical Impression(s) / ED Diagnoses Final diagnoses:  Migraine with status migrainosus, not intractable, unspecified migraine type    Rx / DC Orders ED Discharge Orders    None       Fredia Sorrow, MD 01/14/20 2109

## 2020-01-14 NOTE — ED Triage Notes (Signed)
Headache x 4 days. Nausea.

## 2020-01-20 ENCOUNTER — Emergency Department (HOSPITAL_BASED_OUTPATIENT_CLINIC_OR_DEPARTMENT_OTHER): Payer: Self-pay

## 2020-01-20 ENCOUNTER — Other Ambulatory Visit: Payer: Self-pay

## 2020-01-20 ENCOUNTER — Encounter (HOSPITAL_BASED_OUTPATIENT_CLINIC_OR_DEPARTMENT_OTHER): Payer: Self-pay

## 2020-01-20 ENCOUNTER — Emergency Department (HOSPITAL_BASED_OUTPATIENT_CLINIC_OR_DEPARTMENT_OTHER)
Admission: EM | Admit: 2020-01-20 | Discharge: 2020-01-20 | Disposition: A | Discharge status: Home / Self Care | Payer: Self-pay | Attending: Emergency Medicine | Admitting: Emergency Medicine

## 2020-01-20 DIAGNOSIS — B9689 Other specified bacterial agents as the cause of diseases classified elsewhere: Secondary | ICD-10-CM

## 2020-01-20 DIAGNOSIS — R1032 Left lower quadrant pain: Secondary | ICD-10-CM | POA: Insufficient documentation

## 2020-01-20 DIAGNOSIS — J45909 Unspecified asthma, uncomplicated: Secondary | ICD-10-CM | POA: Insufficient documentation

## 2020-01-20 DIAGNOSIS — R1030 Lower abdominal pain, unspecified: Secondary | ICD-10-CM

## 2020-01-20 DIAGNOSIS — R112 Nausea with vomiting, unspecified: Secondary | ICD-10-CM | POA: Insufficient documentation

## 2020-01-20 DIAGNOSIS — R197 Diarrhea, unspecified: Secondary | ICD-10-CM | POA: Insufficient documentation

## 2020-01-20 DIAGNOSIS — R1031 Right lower quadrant pain: Secondary | ICD-10-CM | POA: Insufficient documentation

## 2020-01-20 DIAGNOSIS — Z87891 Personal history of nicotine dependence: Secondary | ICD-10-CM | POA: Insufficient documentation

## 2020-01-20 LAB — URINALYSIS, MICROSCOPIC (REFLEX)

## 2020-01-20 LAB — COMPREHENSIVE METABOLIC PANEL
ALT: 32 U/L (ref 0–44)
AST: 23 U/L (ref 15–41)
Albumin: 3.9 g/dL (ref 3.5–5.0)
Alkaline Phosphatase: 70 U/L (ref 38–126)
Anion gap: 11 (ref 5–15)
BUN: 9 mg/dL (ref 6–20)
CO2: 23 mmol/L (ref 22–32)
Calcium: 9.1 mg/dL (ref 8.9–10.3)
Chloride: 104 mmol/L (ref 98–111)
Creatinine, Ser: 0.57 mg/dL (ref 0.44–1.00)
GFR calc Af Amer: >60 mL/min (ref >60)
GFR calc non Af Amer: >60 mL/min (ref >60)
Glucose, Bld: 89 mg/dL (ref 70–99)
Potassium: 3.7 mmol/L (ref 3.5–5.1)
Sodium: 138 mmol/L (ref 135–145)
Total Bilirubin: 0.2 mg/dL — ABNORMAL LOW (ref 0.3–1.2)
Total Protein: 7.8 g/dL (ref 6.5–8.1)

## 2020-01-20 LAB — CBC WITH DIFFERENTIAL/PLATELET
Abs Immature Granulocytes: 0.06 10*3/uL (ref 0.00–0.07)
Basophils Absolute: 0 10*3/uL (ref 0.0–0.1)
Basophils Relative: 0 %
Eosinophils Absolute: 0.1 10*3/uL (ref 0.0–0.5)
Eosinophils Relative: 1 %
HCT: 40.5 % (ref 36.0–46.0)
Hemoglobin: 12 g/dL (ref 12.0–15.0)
Immature Granulocytes: 0 %
Lymphocytes Relative: 20 %
Lymphs Abs: 3 10*3/uL (ref 0.7–4.0)
MCH: 23 pg — ABNORMAL LOW (ref 26.0–34.0)
MCHC: 29.6 g/dL — ABNORMAL LOW (ref 30.0–36.0)
MCV: 77.7 fL — ABNORMAL LOW (ref 80.0–100.0)
Monocytes Absolute: 0.4 10*3/uL (ref 0.1–1.0)
Monocytes Relative: 3 %
Neutro Abs: 11.4 10*3/uL — ABNORMAL HIGH (ref 1.7–7.7)
Neutrophils Relative %: 76 %
Platelets: 256 10*3/uL (ref 150–400)
RBC: 5.21 MIL/uL — ABNORMAL HIGH (ref 3.87–5.11)
RDW: 13.4 % (ref 11.5–15.5)
WBC: 15 10*3/uL — ABNORMAL HIGH (ref 4.0–10.5)
nRBC: 0 % (ref 0.0–0.2)

## 2020-01-20 LAB — WET PREP, GENITAL
Sperm: NONE SEEN
Trich, Wet Prep: NONE SEEN
Yeast Wet Prep HPF POC: NONE SEEN

## 2020-01-20 LAB — URINALYSIS, ROUTINE W REFLEX MICROSCOPIC
Bilirubin Urine: NEGATIVE
Glucose, UA: NEGATIVE mg/dL
Ketones, ur: NEGATIVE mg/dL
Nitrite: NEGATIVE
Protein, ur: NEGATIVE mg/dL
Specific Gravity, Urine: 1.025 (ref 1.005–1.030)
pH: 6.5 (ref 5.0–8.0)

## 2020-01-20 LAB — PREGNANCY, URINE: Preg Test, Ur: NEGATIVE

## 2020-01-20 LAB — LIPASE, BLOOD: Lipase: 17 U/L (ref 11–51)

## 2020-01-20 MED ORDER — ONDANSETRON HCL 4 MG/2ML IJ SOLN
4.0000 mg | Freq: Once | INTRAMUSCULAR | Status: AC
  Administered 2020-01-20: 4 mg via INTRAVENOUS
  Filled 2020-01-20: qty 2

## 2020-01-20 MED ORDER — HYDROMORPHONE HCL 1 MG/ML IJ SOLN
INTRAMUSCULAR | Status: AC
  Filled 2020-01-20: qty 1

## 2020-01-20 MED ORDER — HYDROMORPHONE HCL 1 MG/ML IJ SOLN
1.0000 mg | Freq: Once | INTRAMUSCULAR | Status: AC
  Administered 2020-01-20: 1 mg via INTRAVENOUS

## 2020-01-20 MED ORDER — MORPHINE SULFATE (PF) 4 MG/ML IV SOLN
4.0000 mg | Freq: Once | INTRAVENOUS | Status: AC
  Administered 2020-01-20: 4 mg via INTRAVENOUS
  Filled 2020-01-20: qty 1

## 2020-01-20 MED ORDER — METRONIDAZOLE 500 MG PO TABS: 500.0000 mg | ORAL_TABLET | Freq: Two times a day (BID) | ORAL | 0 refills | Status: AC

## 2020-01-20 MED ORDER — ONDANSETRON HCL 4 MG/2ML IJ SOLN: 4.0000 mg | Freq: Once | INTRAMUSCULAR | Status: AC

## 2020-01-20 MED ORDER — IOHEXOL 300 MG/ML  SOLN
100.0000 mL | Freq: Once | INTRAMUSCULAR | Status: AC | PRN
  Administered 2020-01-20: 100 mL via INTRAVENOUS

## 2020-01-20 MED ORDER — ONDANSETRON HCL 4 MG/2ML IJ SOLN
INTRAMUSCULAR | Status: AC
  Administered 2020-01-20: 4 mg via INTRAVENOUS
  Filled 2020-01-20: qty 2

## 2020-01-20 MED ORDER — SODIUM CHLORIDE 0.9 % IV BOLUS
1000.0000 mL | Freq: Once | INTRAVENOUS | Status: AC
  Administered 2020-01-20: 1000 mL via INTRAVENOUS

## 2020-01-20 NOTE — ED Provider Notes (Signed)
[facility] EMERGENCY DEPARTMENT Provider Note   CSN: [id number] Arrival date & time: 01/20/20  1228     History Chief Complaint  Patient presents with  . Vaginal Pain  . Abdominal Pain    Traci Rogers is a 34 y.o. female.  Pt presents to the ED today with abdominal pain and vaginal pain.  The pt said she was here on 8/5 with a migraine.  Pt said the headache has improved, but she has continued to have n/v.  Now, she has abdominal pain; mainly lower.  She has pain in her vagina, but no discharge.  She feels like she is urinating frequently, but it does not hurt to urinate.  Pt did have some diarrhea yesterday.           Past Medical History:  Diagnosis Date  . Anxiety   . Arrhythmia   . Asthma   . Depression   . Insomnia   . PTSD (post-traumatic stress disorder)     Patient Active Problem List   Diagnosis Date Noted  . Depression 12/04/2018  . Seizures ([place]) 12/04/2018    Past Surgical History:  Procedure Laterality Date  . DILATION AND CURETTAGE OF UTERUS    . WRIST GANGLION EXCISION       OB History   No obstetric history on file.     Family History  Problem Relation Age of Onset  . Hypertension Mother     Social History   Tobacco Use  . Smoking status: Former Smoker    Types: Cigars    Quit date: 08/09/2015    Years since quitting: 4.4  . Smokeless tobacco: Never Used  Vaping Use  . Vaping Use: Never used  Substance Use Topics  . Alcohol use: Yes    Comment: occasional  . Drug use: No    Home Medications Prior to Admission medications   Medication Sig Start Date End Date Taking? Authorizing Provider  diazepam (VALIUM) 5 MG tablet Take 1-2 tablets (5-10 mg total) by mouth at bedtime as needed for muscle spasms. 10/08/19   Molpus, John, MD  escitalopram (LEXAPRO) 5 MG/5ML solution Take 10 mLs (10 mg total) by mouth daily. 12/04/18   Maximiano Coss, NP  fluconazole (DIFLUCAN) 200 MG tablet Take 1 tablet by mouth after  antibiotics 12/18/19   Volanda Napoleon, PA-C  HYDROcodone-acetaminophen (NORCO) 5-325 MG tablet Take 1 tablet by mouth every 4 (four) hours as needed for severe pain. 10/03/19   Molpus, John, MD  Multiple Vitamin (MULTIVITAMIN) tablet Take 1 tablet by mouth daily.    [provider]  naproxen (NAPROSYN) 375 MG tablet Take 1 tablet twice daily for back pain. 10/03/19   Molpus, John, MD    Allergies    Calamine and Poison ivy extract  Review of Systems   Review of Systems  Gastrointestinal: Positive for abdominal pain, diarrhea, nausea and vomiting.  All other systems reviewed and are negative.   Physical Exam Updated Vital Signs BP 122/89 (BP Location: Right Arm)   Pulse 75   Temp 98.4 F (36.9 C) (Oral)   Resp 18   Ht 5\' 2"  (1.575 m)   Wt 100.7 kg   LMP 01/06/2020   SpO2 100%   BMI 40.60 kg/m   Physical Exam Vitals and nursing note reviewed. Exam conducted with a chaperone present.  Constitutional:      Appearance: She is well-developed.  HENT:     Head: Normocephalic and atraumatic.     Mouth/Throat:  Mouth: Mucous membranes are dry.     Pharynx: Oropharynx is clear.  Eyes:     Extraocular Movements: Extraocular movements intact.     Pupils: Pupils are equal, round, and reactive to light.  Cardiovascular:     Rate and Rhythm: Normal rate and regular rhythm.  Pulmonary:     Effort: Pulmonary effort is normal.     Breath sounds: Normal breath sounds.  Abdominal:     General: Abdomen is flat. Bowel sounds are normal.     Palpations: Abdomen is soft.     Tenderness: There is abdominal tenderness in the right lower quadrant, suprapubic area and left lower quadrant.  Genitourinary:    Vagina: Normal.     Cervix: Normal.     Uterus: Normal.      Adnexa: Right adnexa normal and left adnexa normal.  Skin:    General: Skin is warm.     Capillary Refill: Capillary refill takes less than 2 seconds.  Neurological:     General: No focal deficit present.      Mental Status: She is alert and oriented to person, place, and time.  Psychiatric:        Mood and Affect: Mood normal.        Behavior: Behavior normal.     ED Results / Procedures / Treatments   Labs (all labs ordered are listed, but only abnormal results are displayed) Labs Reviewed  URINALYSIS, ROUTINE W REFLEX MICROSCOPIC - Abnormal; Notable for the following components:      Result Value   Hgb urine dipstick TRACE (*)    Leukocytes,Ua TRACE (*)    All other components within normal limits  URINALYSIS, MICROSCOPIC (REFLEX) - Abnormal; Notable for the following components:   Bacteria, UA FEW (*)    All other components within normal limits  CBC WITH DIFFERENTIAL/PLATELET - Abnormal; Notable for the following components:   WBC 15.0 (*)    RBC 5.21 (*)    MCV 77.7 (*)    MCH 23.0 (*)    MCHC 29.6 (*)    Neutro Abs 11.4 (*)    All other components within normal limits  COMPREHENSIVE METABOLIC PANEL - Abnormal; Notable for the following components:   Total Bilirubin 0.2 (*)    All other components within normal limits  WET PREP, GENITAL  PREGNANCY, URINE  LIPASE, BLOOD  GC/CHLAMYDIA PROBE AMP ([place]) NOT AT [facility]    EKG None  Radiology CT Abdomen Pelvis W Contrast  Result Date: 01/20/2020 CLINICAL DATA:  Acute abdominal and pelvic pain for approximately 2 days. EXAM: CT ABDOMEN AND PELVIS WITH CONTRAST TECHNIQUE: Multidetector CT imaging of the abdomen and pelvis was performed using the standard protocol following bolus administration of intravenous contrast. CONTRAST:  112mL OMNIPAQUE IOHEXOL 300 MG/ML  SOLN COMPARISON:  Noncontrast CT on 05/20/2016 from [place]: Lower Chest: No acute findings. Hepatobiliary: No hepatic masses identified. Gallbladder is unremarkable. No evidence of biliary ductal dilatation. Pancreas:  No mass or inflammatory changes. Spleen: Within normal limits in size and appearance. Adrenals/Urinary Tract: No masses identified.  Tiny sub-cm cyst noted in lower pole of left kidney. No evidence of ureteral calculi or hydronephrosis. Stomach/Bowel: No evidence of obstruction, inflammatory process or abnormal fluid collections. Normal appendix visualized. Vascular/Lymphatic: No pathologically enlarged lymph nodes. No abdominal aortic aneurysm. Reproductive:  No mass or other significant abnormality. Other:  None. Musculoskeletal:  No suspicious bone lesions identified. IMPRESSION: Negative. No acute findings or other significant abnormality. Electronically Signed  By: Marlaine Hind M.D.   On: 01/20/2020 15:02    Procedures Procedures (including critical care time)  Medications Ordered in ED Medications  morphine 4 MG/ML injection 4 mg (has no administration in time range)  ondansetron (ZOFRAN) injection 4 mg (4 mg Intravenous Given 01/20/20 1435)  morphine 4 MG/ML injection 4 mg (4 mg Intravenous Given 01/20/20 1436)  iohexol (OMNIPAQUE) 300 MG/ML solution 100 mL (100 mLs Intravenous Contrast Given 01/20/20 1439)  sodium chloride 0.9 % bolus 1,000 mL (1,000 mLs Intravenous New Bag/Given 01/20/20 1505)    ED Course  I have reviewed the triage vital signs and the nursing notes.  Pertinent labs & imaging results that were available during my care of the patient were reviewed by me and considered in my medical decision making (see chart for details).    MDM Rules/Calculators/A&P                          Pt's CT scan is ok.  WBC is slightly elevated at 15.  Pt has some adnexal tenderness, so I will add on a pelvic US.  Pelvic labs are pending.  Pt signed out to Dr. Sherry Ruffing at shift change.   Final Clinical Impression(s) / ED Diagnoses Final diagnoses:  Lower abdominal pain    Rx / DC Orders ED Discharge Orders    None       Isla Pence, MD 01/20/20 [phone]

## 2020-01-20 NOTE — ED Provider Notes (Signed)
3:57 PM Care assumed from Dr. Gilford Raid.  At time of transfer of care, patient is awaiting results of pelvic ultrasound to help determine etiology of the patient's lower abdominal discomfort today.  She reportedly did not have dysuria symptoms and with the trace leukocytes and few bacteria, previous team did not feel she had UTI.  She did have a leukocytosis prompting the ultrasound.  CMP reassuring.  No anemia.  Pregnancy test negative.  CT scan was negative showing no acute findings or other abnormalities.  Wet prep returned showing clue cells, will treat for BV at discharge.  Previous team did not feel she has PID unless ultrasound shows no convincing evidence otherwise as there is no discharge and minimal discomfort on exam aside from some adnexal tenderness.  Patient was already given pain medicine.  Anticipate reassessment after ultrasound.  6:59 PM Patient required Dilaudid for pain with significant proved her symptoms.  Ultrasound returned showing no evidence of torsion or other abnormality.  Some free fluid was seen, I suspect she may have had a ruptured cyst or some other etiology like this.  Patient agrees.  She was positive for BV and was given prescription for antibiotics.  She will follow-up with PCP and OB/GYN.  She agrees with plan of care and follow-up instructions.  Patient discharged in good condition after reassuring work-up otherwise.  Clinical Impression: 1. Lower abdominal pain   2. BV (bacterial vaginosis)     Disposition: Discharge  Condition: Good  I have discussed the results, Dx and Tx plan with the pt(& family if present). He/she/they expressed understanding and agree(s) with the plan. Discharge instructions discussed at great length. Strict return precautions discussed and pt &/or family have verbalized understanding of the instructions. No further questions at time of discharge.    New Prescriptions   METRONIDAZOLE (FLAGYL) 500 MG TABLET    Take 1 tablet (500 mg  total) by mouth 2 (two) times daily.    Follow Up: Maximiano Coss, NP [place] [id number] [phone]     [place] [address] [id number] CW [facility] [place]        Jaydn Fincher, Gwenyth Allegra, MD 01/20/20 1900

## 2020-01-20 NOTE — Discharge Instructions (Signed)
Your work-up today was consistent with BV and potentially a ruptured ovarian cyst that cause her discomfort.  There is no evidence of surgical problem or other abnormality such as torsion on her work-up.  Please take the antibiotics for the bacterial vaginosis we discussed and follow-up with your PCP and OB/GYN.  If any symptoms change or worsen, return to the nearest emergency department.  Please rest and stay hydrated.

## 2020-01-20 NOTE — ED Triage Notes (Addendum)
Pt c/o vaginal pain x 2 days-denies vaginal /dc-NAD-steady slow gait-stood in triage

## 2020-01-21 LAB — GC/CHLAMYDIA PROBE AMP (~~LOC~~) NOT AT ARMC
Chlamydia: NEGATIVE
Comment: NEGATIVE
Comment: NORMAL
Neisseria Gonorrhea: NEGATIVE
